# Patient Record
Sex: Female | Born: 1985 | Race: White | Hispanic: No | Marital: Single
Health system: Midwestern US, Community
[De-identification: ages and names within clinical notes are randomized; demographics above are authoritative.]

## PROBLEM LIST (undated history)

## (undated) DIAGNOSIS — F329 Major depressive disorder, single episode, unspecified: Secondary | ICD-10-CM

## (undated) DIAGNOSIS — J45909 Unspecified asthma, uncomplicated: Secondary | ICD-10-CM

## (undated) DIAGNOSIS — N200 Calculus of kidney: Secondary | ICD-10-CM

## (undated) DIAGNOSIS — G459 Transient cerebral ischemic attack, unspecified: Secondary | ICD-10-CM

## (undated) DIAGNOSIS — Q2111 Secundum atrial septal defect: Secondary | ICD-10-CM

## (undated) DIAGNOSIS — F411 Generalized anxiety disorder: Secondary | ICD-10-CM

## (undated) DIAGNOSIS — Q211 Atrial septal defect: Secondary | ICD-10-CM

## (undated) DIAGNOSIS — I639 Cerebral infarction, unspecified: Secondary | ICD-10-CM

## (undated) DIAGNOSIS — F3289 Other specified depressive episodes: Secondary | ICD-10-CM

## (undated) HISTORY — DX: Transient cerebral ischemic attack, unspecified: G45.9

## (undated) HISTORY — DX: Secundum atrial septal defect: Q21.11

## (undated) HISTORY — DX: Generalized anxiety disorder: F41.1

## (undated) HISTORY — DX: Other specified depressive episodes: F32.89

## (undated) HISTORY — DX: Major depressive disorder, single episode, unspecified: F32.9

## (undated) HISTORY — DX: Atrial septal defect: Q21.1

## (undated) HISTORY — DX: Cerebral infarction, unspecified: I63.9

## (undated) HISTORY — DX: Calculus of kidney: N20.0

---

## 2004-09-20 ENCOUNTER — Ambulatory Visit (HOSPITAL_COMMUNITY): Admission: RE | Admit: 2004-09-20 | Discharge: 2004-09-20 | Payer: Self-pay | Admitting: Urology

## 2005-09-27 ENCOUNTER — Other Ambulatory Visit: Admission: RE | Admit: 2005-09-27 | Discharge: 2005-09-27 | Payer: Self-pay | Admitting: Gynecology

## 2006-05-09 ENCOUNTER — Emergency Department (HOSPITAL_COMMUNITY): Admission: EM | Admit: 2006-05-09 | Discharge: 2006-05-09 | Payer: Self-pay | Admitting: Emergency Medicine

## 2007-01-10 ENCOUNTER — Other Ambulatory Visit: Admission: RE | Admit: 2007-01-10 | Discharge: 2007-01-10 | Payer: Self-pay | Admitting: Gynecology

## 2007-08-26 ENCOUNTER — Other Ambulatory Visit: Admission: RE | Admit: 2007-08-26 | Discharge: 2007-08-26 | Payer: Self-pay | Admitting: Gynecology

## 2007-09-19 ENCOUNTER — Emergency Department (HOSPITAL_COMMUNITY): Admission: EM | Admit: 2007-09-19 | Discharge: 2007-09-19 | Payer: Self-pay | Admitting: Emergency Medicine

## 2007-09-30 ENCOUNTER — Ambulatory Visit: Payer: Self-pay

## 2007-09-30 ENCOUNTER — Encounter (INDEPENDENT_AMBULATORY_CARE_PROVIDER_SITE_OTHER): Payer: Self-pay | Admitting: Neurology

## 2007-10-05 DIAGNOSIS — I639 Cerebral infarction, unspecified: Secondary | ICD-10-CM

## 2007-10-05 HISTORY — DX: Cerebral infarction, unspecified: I63.9

## 2007-10-16 ENCOUNTER — Ambulatory Visit: Payer: Self-pay

## 2007-10-24 ENCOUNTER — Ambulatory Visit: Payer: Self-pay | Admitting: Internal Medicine

## 2007-11-04 ENCOUNTER — Ambulatory Visit: Payer: Self-pay | Admitting: Cardiology

## 2007-11-04 ENCOUNTER — Ambulatory Visit (HOSPITAL_COMMUNITY): Admission: RE | Admit: 2007-11-04 | Discharge: 2007-11-04 | Payer: Self-pay | Admitting: Cardiology

## 2007-12-05 HISTORY — PX: PATENT FORAMEN OVALE CLOSURE: SHX2181

## 2008-02-17 ENCOUNTER — Ambulatory Visit: Payer: Self-pay | Admitting: Cardiology

## 2008-02-17 LAB — CONVERTED CEMR LAB
Basophils Absolute: 0.2 10*3/uL — ABNORMAL HIGH (ref 0.0–0.1)
Eosinophils Absolute: 0.1 10*3/uL (ref 0.0–0.6)
Eosinophils Relative: 1.7 % (ref 0.0–5.0)
HCT: 39.7 % (ref 36.0–46.0)
Lymphocytes Relative: 25 % (ref 12.0–46.0)
MCV: 91.4 fL (ref 78.0–100.0)
Monocytes Absolute: 0.3 10*3/uL (ref 0.2–0.7)
RBC: 4.34 M/uL (ref 3.87–5.11)
RDW: 12 % (ref 11.5–14.6)
TSH: 0.94 microintl units/mL (ref 0.35–5.50)

## 2008-04-03 ENCOUNTER — Other Ambulatory Visit: Admission: RE | Admit: 2008-04-03 | Discharge: 2008-04-03 | Payer: Self-pay | Admitting: Gynecology

## 2008-04-24 ENCOUNTER — Ambulatory Visit: Payer: Self-pay | Admitting: Cardiology

## 2008-04-24 ENCOUNTER — Ambulatory Visit: Payer: Self-pay

## 2008-04-24 ENCOUNTER — Encounter: Payer: Self-pay | Admitting: Cardiology

## 2009-01-18 ENCOUNTER — Encounter: Payer: Self-pay | Admitting: Cardiology

## 2009-01-18 ENCOUNTER — Ambulatory Visit: Payer: Self-pay | Admitting: Cardiology

## 2009-01-18 ENCOUNTER — Ambulatory Visit: Payer: Self-pay

## 2009-04-14 ENCOUNTER — Telehealth: Payer: Self-pay | Admitting: Cardiology

## 2009-04-27 ENCOUNTER — Emergency Department (HOSPITAL_COMMUNITY): Admission: EM | Admit: 2009-04-27 | Discharge: 2009-04-27 | Payer: Self-pay | Admitting: Emergency Medicine

## 2009-04-30 DIAGNOSIS — G459 Transient cerebral ischemic attack, unspecified: Secondary | ICD-10-CM | POA: Insufficient documentation

## 2009-04-30 DIAGNOSIS — Q211 Atrial septal defect: Secondary | ICD-10-CM | POA: Insufficient documentation

## 2009-04-30 DIAGNOSIS — F329 Major depressive disorder, single episode, unspecified: Secondary | ICD-10-CM | POA: Insufficient documentation

## 2009-04-30 DIAGNOSIS — F411 Generalized anxiety disorder: Secondary | ICD-10-CM | POA: Insufficient documentation

## 2009-08-19 ENCOUNTER — Encounter: Payer: Self-pay | Admitting: Cardiology

## 2009-09-03 ENCOUNTER — Ambulatory Visit: Payer: Self-pay | Admitting: Women's Health

## 2009-09-03 ENCOUNTER — Other Ambulatory Visit: Admission: RE | Admit: 2009-09-03 | Discharge: 2009-09-03 | Payer: Self-pay | Admitting: Gynecology

## 2009-09-03 ENCOUNTER — Encounter: Payer: Self-pay | Admitting: Women's Health

## 2009-11-05 ENCOUNTER — Ambulatory Visit: Payer: Self-pay | Admitting: Women's Health

## 2010-05-18 ENCOUNTER — Ambulatory Visit: Payer: Self-pay | Admitting: Gynecology

## 2010-06-24 ENCOUNTER — Encounter: Admission: RE | Admit: 2010-06-24 | Discharge: 2010-06-24 | Payer: Self-pay | Admitting: Gastroenterology

## 2010-07-28 ENCOUNTER — Ambulatory Visit: Payer: Self-pay | Admitting: Gynecology

## 2010-08-03 ENCOUNTER — Telehealth: Payer: Self-pay | Admitting: Cardiology

## 2010-11-16 ENCOUNTER — Ambulatory Visit: Payer: Self-pay | Admitting: Women's Health

## 2010-12-09 ENCOUNTER — Encounter
Admission: RE | Admit: 2010-12-09 | Discharge: 2010-12-09 | Payer: Self-pay | Source: Home / Self Care | Attending: Family Medicine | Admitting: Family Medicine

## 2010-12-25 ENCOUNTER — Encounter: Payer: Self-pay | Admitting: Family Medicine

## 2010-12-25 ENCOUNTER — Encounter: Payer: Self-pay | Admitting: Gastroenterology

## 2011-01-03 NOTE — Progress Notes (Signed)
Summary: calling re form  Phone Note Call from Patient   Caller: Patient Reason for Call: Talk to Nurse Summary of Call: pt sending a fax re preexisting conditions-would like a call before form filled out to make sure we do it correctly-pls call [phone] Initial call taken by: Glynda Jaeger,  August 03, 2010 1:25 PM  Follow-up for Phone Call        spoke with pt, she is aware I am out of the [place] office until friday. will call and discuss paperwork with her on friday Deliah Goody, RN  August 03, 2010 2:06 PM  left message for pt of our fax number and to refax to me today if poss Deliah Goody, RN  August 09, 2010 11:08 AM   Additional Follow-up for Phone Call Additional follow up Details #1::        paperwork complete and mailed to insurance company Deliah Goody, RN  August 10, 2010 3:34 PM      Appended Document: calling re form Patient called to request original form from 08/20/10 to get faxed to [employer]. We are unable to process her request without a fax or phone number. Form placed in the mail.

## 2011-03-14 LAB — URINALYSIS, ROUTINE W REFLEX MICROSCOPIC
Nitrite: NEGATIVE
Protein, ur: NEGATIVE mg/dL
Specific Gravity, Urine: 1.025 (ref 1.005–1.030)
Urobilinogen, UA: 0.2 mg/dL (ref 0.0–1.0)

## 2011-03-14 LAB — DIFFERENTIAL
Eosinophils Absolute: 0.1 10*3/uL (ref 0.0–0.7)
Lymphocytes Relative: 25 % (ref 12–46)
Lymphs Abs: 1.7 10*3/uL (ref 0.7–4.0)
Neutro Abs: 4.5 10*3/uL (ref 1.7–7.7)
Neutrophils Relative %: 65 % (ref 43–77)

## 2011-03-14 LAB — POCT CARDIAC MARKERS
CKMB, poc: <1 ng/mL — ABNORMAL LOW (ref 1.0–8.0)
Myoglobin, poc: 29.2 ng/mL (ref 12–200)

## 2011-03-14 LAB — BASIC METABOLIC PANEL
BUN: 11 mg/dL (ref 6–23)
Calcium: 9 mg/dL (ref 8.4–10.5)
Creatinine, Ser: 0.74 mg/dL (ref 0.4–1.2)
GFR calc Af Amer: >60 mL/min (ref >60)
GFR calc non Af Amer: >60 mL/min (ref >60)

## 2011-03-14 LAB — CBC
MCV: 94.2 fL (ref 78.0–100.0)
Platelets: 219 10*3/uL (ref 150–400)
WBC: 6.9 10*3/uL (ref 4.0–10.5)

## 2011-03-14 LAB — POCT PREGNANCY, URINE: Preg Test, Ur: NEGATIVE

## 2011-04-04 DIAGNOSIS — N200 Calculus of kidney: Secondary | ICD-10-CM

## 2011-04-04 HISTORY — DX: Calculus of kidney: N20.0

## 2011-04-04 HISTORY — PX: OTHER SURGICAL HISTORY: SHX169

## 2011-04-18 NOTE — Letter (Signed)
October 24, 2007    Pramod P. Pearlean Brownie, MD.  [address]  [address].  [place], [place], [address].   RE:  Gloria, Cruz  MRN:  [id number]  /  DOB:  08-Jun-1986   Dear Janalyn Shy:   I hope you are well.   It was a pleasure to see [name] at your request.  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 Anne Hahn.  That was an isolated episode, it  resolved in its entirety.   She underwent a series of MRI scans, which found nothing specific.  She  was then referred to you for a transcranial Doppler, which was abnormal  suggestive of a large right-to-left shunt.   She has in her past life been quite fit, playing tennis and swimming,  she is a Consulting civil engineer now.   Her past medical history in addition to above is notable for anxiety.   Her medications when you saw her included Wellbutrin and Ritalin taken  as needed, and she also takes birth control pills, it is not clear to me  whether she is taking those now or not.  She is currently taking aspirin  at 81 mg.   She has no known drug allergies.   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   PHYSICAL EXAMINATION:  GENERAL:  She is a healthy-appearing, young,  Caucasian female appearing her stated age of 22.  VITAL SIGNS:  Her blood pressure was 117/70, her pulse was 81, her  weight was 121.  HEENT EXAM:  No icterus or xanthomata.  NECK:  Neck veins were flat, the carotids were brisk and full  bilaterally without bruits.  BACK:  Without kyphosis or scoliosis.  LUNGS:  Clear.  HEART:  The  heart sounds were regular with a normally splitting S2.  ABDOMEN:  Soft with active bowel sounds without midline pulsation or  hepatomegaly.  EXTREMITIES:  Femoral pulses were 2+, distal pulses were intact, and  there was no clubbing, cyanosis, or edema.  NEUROLOGICAL EXAM:  Grossly normal.  SKIN:  Warm and dry.   Electrocardiogram dated today demonstrated sinus rhythm at 81 with  intervals of .15/.07/.38, the axis was 77 degrees.   Review of her ultrasound done without bubbles on the 27th of October  demonstrated normal left ventricular function, normal left atrial and  right atrial size, and right ventricular volumes as well.   IMPRESSION:  1. Transient neurological spell of unclear mechanistic cause.  2. Abnormal transcranial Doppler study suggestive of a patent foramen      ovale (PFO).  3. Normal transthoracic echocardiogram.   Janalyn Shy Akesha Uresti has an abnormal transcranial Doppler study in  the context of an unusual neurological event.  I spent quite a long time  trying to review with the patient and her mother the physiology of left-  to-right shunt, the potential mechanisms of left-to-right shunt, and  also shared with them some of the controversy related to treatment of  left-to-right shunt in the absence or even in  the presence of symptoms  and the lack of certainty that therapy is associated with the  elimination of risks of recurrent neurological events.   I did discuss with my colleague the importance of fully understanding  the mechanism of left-to-right shunting and so we have arranged for her  to have a transesophageal echo.   In addition, given the controversies related to PFO closure, I thought  an academic opinion would be most appropriate for this young lady.   I have taken the privilege of contacting Dr. Nolon Rod at [facility], who is  quite familiar with this whole issue, and I am sure he will look forward  to trying to understand with Dr. Anne Hahn  whether  the patient's neurological event is explainable as a potential  paradoxical embolism issue and if so what would be appropriate options  for the patient to consider.   Thank you very much for the consultation.    Sincerely,      Duke Salvia, MD, [facility]  Electronically Signed    SCK/MedQ  DD: 10/24/2007  DT: 10/24/2007  Job #: [id number]

## 2011-04-18 NOTE — Assessment & Plan Note (Signed)
[facility] HEALTHCARE                            CARDIOLOGY OFFICE NOTE   SERRA, YOUNAN                    MRN:          [id number]  DATE:04/24/2008                            DOB:          07/27/1986    HISTORY OF PRESENT ILLNESS:  Ms. [name] is a pleasant 25 year old  female who has a history of PFO associated with a TIA.  She underwent  PFO closure by Dr. Evelene Croon at [facility] in January 2009.  When I  last saw her on 02/17/2008, she was having some difficulties with  Plavix.  However, she did complete this through the middle of May and  now is off of this.  She has felt well.  She has had no rashes, and she  denies any shortness of breath, chest pain, palpitations or syncope.  There is no pedal edema.   MEDICATIONS:  Baby aspirin 81 mg daily.   PHYSICAL EXAMINATION:  VITAL SIGNS:  Blood pressure of 107/73, pulse 71.  She weighs 121 pounds.  HEENT:  Normal.  NECK:  Supple.  CHEST:  Clear.  CARDIOVASCULAR:  Regular rate and rhythm.  ABDOMEN:  Shows no tenderness.  EXTREMITIES:  Show no edema.  She did have an echocardiogram performed  on Apr 24, 2008.  This showed normal LV function.  There is trivial  aortic insufficiency.  There is a catheter atrial septal defect  occlusion device closure with no identified residual left-to-right  shunting.   DIAGNOSES:  1. Status post closure of PFO in early January.  Ms. [name] has had      no recurrent TIA symptoms and she is now off of her Plavix.  She      will continue her aspirin 81 mg p.o. daily.  Previous CBC and TSH      were normal.  She will need a follow-up echocardiogram in January,      and if there is no evidence of right-to-left shunt, I will see her      back on as-needed basis.  She will continue with SBE prophylaxis.      We will see her back in January after she has her echocardiogram.  2. History of transient ischemic attack.  As per #1.     Madolyn Frieze Jens Som, MD, [facility]  Electronically Signed    BSC/MedQ  DD: 04/24/2008  DT: 04/24/2008  Job #: [id number]   cc:   Cardiology, [facility] Dr. Roselyn Reef

## 2011-04-18 NOTE — Assessment & Plan Note (Signed)
[facility] HEALTHCARE                            CARDIOLOGY OFFICE NOTE   [name], [name]                    MRN:          [id number]  DATE:11/04/2007                            DOB:          [birth year]    Ms. [name] is a 25 year old patient of Duke Salvia, MD, who was  recently seen for a possible TIA and patent foramen ovale.  I performed  a transesophageal echocardiogram on her today.  We did find that she had  a positive microcavitation study.  Following the procedure I explained  this in detail to her parents.  I explained that it was not clear that  she had had a TIA previously, but Dr. Anne Hahn of neurology was evaluating  this.  Apparently an MRI showed no evidence of a stroke.  If indeed Dr.  Anne Hahn does feel that this was a TIA, then certainly there is a higher  incidence of embolic events with a PFO.  I explained the options for  treating this including aspirin and Coumadin for medical therapy versus  PFO closure.  They have requested to see a physician to consider PFO  closure indeed if Dr. Anne Hahn does feel that this was a TIA.  They  requested to be seen at [facility] and I contacted Dr. Orland Dec, who performs those procedures at [facility].  He will see the  patient in his office on November 06, 2007.  He will make a final  decision concerning whether she does require this procedure or merely  requires aspirin at this point.  I have also explained that she will  need to follow up with Dr. Anne Hahn as scheduled on November 07, 2007, for  further consideration of whether this, indeed, was a TIA versus the  result of a migraine headache.  Note, later in the afternoon after the  procedure, the patient contacted me.  She was not aware of any of the  above as she had not had discussions with her parents and she had been  sedated at the time we finished the case.  I explained all of the above  in detail again to the patient and she stated  that I could speak with  her parents about any of these issues.     Madolyn Frieze Jens Som, MD, [facility]  Electronically Signed    BSC/MedQ  DD: 11/04/2007  DT: 11/05/2007  Job #: [id number]

## 2011-04-18 NOTE — Assessment & Plan Note (Signed)
[facility] HEALTHCARE                            CARDIOLOGY OFFICE NOTE   MICHALLE, RADEMAKER                    MRN:          [id number]  DATE:01/18/2009                            DOB:          1986-08-29    [name] is a pleasant female, who has a history of patent foramen  ovale associated with TIA.  She underwent closure of this by Dr. Evelene Croon  at [facility] in January 2009.  Since I last saw her on Apr 24, 2008, she is doing well with no chest pain, shortness of breath,  palpitations, or syncope.  She has had no neurological symptoms.  Her  medications include a baby aspirin 81 mg p.o. daily.   PHYSICAL EXAMINATION:  VITAL SIGNS:  Blood pressure of 120/72 and her  pulse is 58.  She weighs 130 pounds.  HEENT:  Normal.  NECK:  Supple.  CHEST:  Clear.  CARDIOVASCULAR:  Regular rate and rhythm.  ABDOMEN:  No tenderness.  EXTREMITIES:  No edema.   Electrocardiogram shows sinus rhythm at a rate of 58.  There are no ST  changes noted.   DIAGNOSES:  1. Status post closure of patent foramen ovale in early January 2009 -      [name] has had no recurrent transient ischemic attack      symptoms.  She will continue on her aspirin.  She had an      echocardiogram with bubble study today.  We will wait those      results.  If it is normal, then she will not need to come back and      see [place] except on a p.r.n. basis.  2. History of transient ischemic attack - as per status post closure      of patent foramen ovale in early January 2009.     Madolyn Frieze Jens Som, MD, [facility]  Electronically Signed    BSC/MedQ  DD: 01/18/2009  DT: 01/19/2009  Job #: [id number]

## 2011-04-18 NOTE — Consult Note (Signed)
NAMEHARLO, JASO             ACCOUNT NO.:  [id number]   MEDICAL RECORD NO.:  [id number]          PATIENT TYPE:  EMS   LOCATION:  ED                           FACILITY[facility]  [facility]   PHYSICIAN:  Marlan Palau, M.D.  DATE OF BIRTH:  [birth year]   DATE OF CONSULTATION:  09/19/2007  DATE OF DISCHARGE:                                 CONSULTATION   HISTORY OF PRESENT ILLNESS:  [name] is a 25 year old right-  handed white female born 1985-12-19 with a history of anxiety,  depression, currently followed by [name], M.D.  This patient had  an episode today shortly after taking a 2 hours nap.  The patient had  been under a lot of stress as a student, had a test this morning, took a  nap afterwards, woke up around 11:30 a.m.  The patient answered  telephone found that she could not speak properly.  The patient had  garbled nonsensical speech initially.  The patient felt confused was  unable to figure out how to turn off the TV.  The patient claims she had  her hand on the TV, but did not know what to do with it.  The patient  had no particular focal numbness or weakness on the face, arms or legs.  The patient has had undulation in her ability to talk for about a hour  and a half afterwards, at times be mute, would have to point to things  to get the idea across, but clearly understood spoken speech.  The  patient however, could not perform handwriting either.  The patient  denies any visual field changes and was walking normally and had no  balance issues.  The patient did not develop headache before, during or  after the above event.  The patient has undergone a CT scan of the head  that is unremarkable.  Neurology was asked to see this patient for  further evaluation.   PAST MEDICAL HISTORY:  Significant for:  1. Episode of transient aphasia, duration about an hour and a half      with full resolution.  2. Anxiety and depression.   MEDICATIONS:  At this time  include:  1. Ritalin if needed.  2. Birth control pills.  3. The patient had a recently been on Wellbutrin, has been off this,      was placed on Lexapro took a couple of doses of this and went off      of it.  The patient is on no medications otherwise at this point.   Smokes two to three cigarettes daily.  Drinks alcohol on occasion.   NO KNOWN ALLERGIES.   SOCIAL HISTORY:  The patient is single, lives in [place], [place] was in this area for school.  Patient has no children.   FAMILY MEDICAL HISTORY:  Mother is alive.  Father is alive, both parents  alive and well.  Patient has one brother who is alive and well.  No  family history of seizures or migraine is noted.   REVIEW OF SYSTEMS:  Notable for no recent fevers, chills.  The patient  denies headache.  Denies visual field changes.  Felt somewhat numb all  over with the above event, almost anxious or nervous feeling.  Denied  any weakness per se.  Denies any shortness of breath, chest pain,  palpitations of the heart, although she has noted some increased heart  rate with anxiety.  Denies any problems controlling bowels or bladder.  The patient has not had any blackout episodes.   PHYSICAL EXAMINATION:  VITALS:  Blood pressure 108/67, heart rate 69,  respiratory rate 16, temperature afebrile.  GENERAL:  The patient is a  well-developed white female who is alert, cooperative at the time of  examination.  HEENT EXAMINATION:  The head is atraumatic.  Eyes:  Pupils equal, round  and reactive to light.  Disks are flat bilaterally.  NECK:  Supple.  No carotid bruits noted.  RESPIRATORY EXAMINATION:  Clear.  CARDIOVASCULAR:  Reveals a regular rate and rhythm.  No obvious murmurs,  rubs noted.  EXTREMITIES:  Without significant edema.  NEUROLOGIC EXAMINATION:  Cranial nerves as above.  Facial symmetry is  present.  The patient has good pinprick sensation of face bilaterally,  has good strength of facial muscles, muscles  of head turning, shoulder  shrug bilaterally.  Speech is well enunciated and not aphasic.  Extraocular movements are full, visual fields are full.  Motor testing  shows 05/05 strength in all four.  Good symmetric motor is noted  throughout.  Sensory testing is intact to pinprick, soft touch,  proprioception throughout.  Position sense intact in all four.  No  evidence of extinction is noted.  Patient has good finger-nose-finger,  heel-to-shin.  Gait normal, tandem gait normal.  Romberg negative.  No  drift is seen.  Deep tendon reflexes are symmetric.  Normal toes  downgoing bilaterally.   Laboratory values are unavailable.   CT of the head is as above.   IMPRESSION:  1. Transient episode of aphasia, etiology unclear.  2. History of bilateral renal calculi.   This patient has had a preexisting history of anxiety and depression  followed by [name], M.D.  The episode above occurred after a long  nap, but the duration of the confusion and speech problems was an hour  and a half would unlikely to make this episode a sleep related event.  Need to consider a migraine equivalent or TIA event.  Risk factors for  stroke would include the use of birth control pills and tobacco.  We  will pursue further workup.   PLAN:  1. MRI of the brain.  2. MRI angiogram of intracranial vessels.  This will be done today.  3. We will check routine blood work and urine drug screen as well.  4. If above studies unremarkable, the patient will be discharged to      home and is to discontinue to use of tobacco as an outpatient.  5. We will consider a workup to include carotid Doppler study, 2-D      echocardiogram and a possible transcranial Doppler bubble study.      We will also consider an EEG study to evaluate for possible seizure-      type event.  NIH stroke scale score is zero.  The patient is not a      candidate for TPA.      Marlan Palau, M.D.  Electronically Signed     CKW/MEDQ   D:  09/19/2007  T:  09/20/2007  Job:  [id number]   cc:  [facility]  [address] [place]  [place], [place]   [name], M.D.  Fax: [phone]

## 2011-04-18 NOTE — Assessment & Plan Note (Signed)
[facility] HEALTHCARE                            CARDIOLOGY OFFICE NOTE   [name], [name]                    MRN:          [id number]  DATE:02/17/2008                            DOB:          18-Mar-1986    [name] is a 25 year old female that was initially seen by Dr. Graciela Husbands  on October 24, 2007.  She had had a prior TIA and had abnormal  transcranial Doppler study suggestive of a PFO.  We subsequently  performed a transesophageal echocardiogram.  She was found to have a  PFO.  She was referred to Dr. Evelene Croon at [facility] for closure.  The procedure was performed on December 12, 2007. Note, a preprocedure  evaluation showed no evidence of a hypercoagulable state.  She has seen  Dr. Evelene Croon back and follow-up echocardiogram showed no residual shunt.  It was recommended that she be treated with aspirin and Plavix for 6  months as well as SBE prophylaxis with follow-up echocardiograms at 6  months and 1 year.  [name] contacted the office today to be seen as  an add on.  The patient states that since the procedure, she has felt  fatigued and weak.  She also states that she had developed significant  bruising on her lower extremities and flu-like symptoms.  She felt this  was related to the Plavix and she did discontinue this medication on her  own 2 weeks ago.  She states the bruising has now improved.  Note she  denies any fevers or chills and there is no productive cough.  She  denies any dyspnea or chest pain.  She did complain of arthralgias which  have improved as well although the fatigue persists.  Because of the  above she wanted to be seen today.   CURRENT MEDICATIONS:  Baby aspirin 81 mg p.o. daily.   PHYSICAL EXAMINATION:  Blood pressure of 115/70 and her pulse is 90.  She weighs 117 pounds.  HEENT:  Normal.  NECK:  Supple.  CHEST:  Clear.  CARDIOVASCULAR:  Regular rate and rhythm.  ABDOMEN:  No tenderness and there is no  organomegaly appreciated.  EXTREMITIES:  Show no edema.  There is a small bruise on her right lower  extremity but there is no other bruising noted.  Note her distal pulses  are intact.  I can not appreciate any stigmata of peripheral emboli.   Her electrocardiogram today shows a sinus rhythm at a rate of 90.  The  axis is normal.  There are no significant ST changes.   DIAGNOSIS:  1. Status post closure of PFO in early January - [name]      discontinued her Plavix on her own 2 weeks ago due to bruising and      flu-like symptoms.  It is not clear to me that this was related to      the Plavix.  I did discuss this with Dr. Evelene Croon today and he would      prefer that she complete at least 3-4 months of Plavix.  I      discussed this  with [name] today and she has agreed to resume      for four additional weeks.  She will continue on aspirin her 81 mg      p.o. daily.  I will check a CBC today to check her hemoglobin,      hematocrit and also to rule out thrombocytopenia.  We will also      check a TSH given her complaint of fatigue.  As stated above, we      will discontinue her Plavix in 4 weeks.  She will need a follow-up      echocardiogram in July and then 6 months following that.  She will      also continue with SBE prophylaxis and we discussed this today.  2. History of transient ischemic attack.   I will see her back in July.  She was explained the risks of  discontinuing her Plavix including potential CVA related to thrombus on  the closure device.     Madolyn Frieze Jens Som, MD, [facility]  Electronically Signed    BSC/MedQ  DD: 02/17/2008  DT: 02/18/2008  Job #: [id number]   cc:   Roena Malady, MD

## 2011-04-20 ENCOUNTER — Telehealth: Payer: Self-pay | Admitting: Cardiology

## 2011-04-20 NOTE — Telephone Encounter (Signed)
Faxed OV, CXR & Labs to [facility] ([phone]).

## 2011-04-21 NOTE — H&P (Signed)
[name], [name]             ACCOUNT NO.:  [id number]   MEDICAL RECORD NO.:  [id number]          PATIENT TYPE:  AMB   LOCATION:  DAY                          FACILITY:  [facility]   PHYSICIAN:  Mark C. Vernie Ammons, M.D.  DATE OF BIRTH:  1986-09-08   DATE OF ADMISSION:  09/20/2004  DATE OF DISCHARGE:                                HISTORY & PHYSICAL   HISTORY:  The patient is a 25 year old white female who was seen in my  office as a new patient earlier in the day.  She had noted that 2 weeks ago  she was having pain in her right side.  It lasted for 4 hours.  She was seen  by her gynecologist who performed an ultrasound.  The pelvic ultrasound was  negative and there was thought there might be a stone. She had pain on the  right side at that time but now she comes in today with pain on the left  side. This is associated with gross hematuria, nausea and vomiting,  frequency, urgency.  She has been on Levaquin, Hydrocodone is not touching  the pain.  She was in severe distress when seen in the office.   PAST MEDICAL HISTORY:  Negative for kidney stones and also negative for any  other major medical problems.   PAST SURGICAL HISTORY:  None.   MEDICATIONS:  None other than as noted above.   ALLERGIES:  No known drug allergies.   SOCIAL HISTORY:  No tobacco or ethanol use.   FAMILY HISTORY:  Father is 34, mother is 75 and they are both in good  health. There is a grandparent with kidney stones.   REVIEW OF SYSTEMS:  As noted above, otherwise completely negative.   Pelvic ultrasound revealed what appeared to be possible ruptured ovarian  cyst and a cyst in the inferior pole on the left hand side.  The right side  appeared normal.   LABORATORY DATA:  Her urinalysis had positive blood, positive protein,  positive leukocyte esterase but no nitrites, and 15 to 50 red cells.   PHYSICAL EXAMINATION:  GENERAL:  The patient is a well-developed, well-  nourished, thin white female in  severe distress.  VITAL SIGNS:  Blood pressure is 131/88, pulse 82, temperature 97.2.  HEENT:  Normocephalic, atraumatic.  Oropharynx is clear.  NECK:  Supple with midline trachea.  CHEST:  Reveals normal respiratory effort.  CARDIOVASCULAR:  Regular rate and rhythm.  ABDOMEN:  Soft without peritoneal signs.  She had no significant CVAT.  She  had mild discomfort to palpation of the lower abdomen bilaterally.  She had  no masses.  GU:  She has normal external female genitalia, normally placed ureteral  meatus and no lesions or discharge.  Vaginal mucosa is normal.  No  periurethral masses or tenderness. No bladder based abnormality.  Cervix,  uterus and adnexae palpably normal. She has normal anus and perineum.  SKIN:  Warm and dry.  EXTREMITIES:  Without clubbing, cyanosis or edema.  NEURO:  She is alert and oriented with appropriate mood and affect.   A CT scan performed in my  office revealed normal appearing kidneys, there  are 2 small punctate stones approximately 1-2 mm in size in her right  kidney.  She has bilateral ureterectasis and bilateral calcifications in the  pelvis near the UVJ on both sides. The bladder otherwise appears normal.   IMPRESSION:  This patient has had gross hematuria, has bilateral flank pain,  and what appeared to be bilateral distal ureteral calculi.  This is  extremely rare.  I discussed that with her and her mother but because of her  severe pain and bilateral hydronephrosis, I have recommended surgical  intervention.  We discussed just a stent placement.  We also discussed  lithotripsy but that is not available at this time.  The best treatment in  my opinion would be ureteroscopic stone extraction at this time.  We  discussed the procedure, its risks and complications including but not  limited to bleeding, infection and anesthetic risk as well as ureteral  perforation.  Understanding, they have elected to proceed with surgery.   PLAN:  1.  She  received 100 mg of Demerol in my office with 25 mg of Phenergan IM      with good control of her pain.  2.  She will be scheduled for semi-emergent bilateral ureteroscopy and stone      extraction.      MCO/MEDQ  D:  09/20/2004  T:  09/20/2004  Job:  [id number]

## 2011-04-21 NOTE — Op Note (Signed)
[name], [name]             ACCOUNT NO.:  [id number]   MEDICAL RECORD NO.:  [id number]          PATIENT TYPE:  AMB   LOCATION:  DAY                          FACILITY:  [facility]   PHYSICIAN:  Mark C. Vernie Ammons, M.D.  DATE OF BIRTH:  06-19-1986   DATE OF PROCEDURE:  09/20/2004  DATE OF DISCHARGE:                                 OPERATIVE REPORT   PREOPERATIVE DIAGNOSIS:  Bilateral ureteral calculi with hydronephrosis.   POSTOPERATIVE DIAGNOSIS:  Bilateral ureteral calculi with hydronephrosis.   PROCEDURE:  Cystoscopy, bilateral retrograde pyelograms with interpretation,  bilateral ureteroscopic stone extraction.   SURGEON:  Mark C. Vernie Ammons, M.D.   ANESTHESIA:  General.   BLOOD LOSS:  None.   DRAINS:  None.   SPECIMENS:  Stone to patient.   COMPLICATIONS:  None.   INDICATIONS:  The patient is a 25 year old white female who had right flank  pain about four days ago.  She then presented to my office today with left  flank pain.  A CT scan revealed bilateral distal ureteral calculi with  hydroureter seen bilaterally as well.  She was in severe pain.  She was  therefore brought to the O.R. semiemergently for stone extraction.  The  risks, complications, and alternatives were discussed.   DESCRIPTION OF OPERATION:  After informed consent, the patient was brought  to the major O.R. and placed on the table and administered general  anesthesia and moved into the dorsal lithotomy position.  Her genitalia were  sterilely prepped and draped, and a 21 French cystoscope with 12-degree lens  was inserted into the bladder.  The bladder was noted to be free of any  tumor, stones, or inflammatory lesions, and the ureteral orifices were  normal in configuration and position.   A 6 French open-ended ureteral catheter was then placed in the right  ureteral orifice, and a right retrograde pyelogram was performed that  revealed a filling defect in the distal right ureter consistent with  stone,  with some dilatation of the ureter proximal to that.  Identical procedure  was performed on the left side, and identical findings were noted.   A 6 French rigid ureteroscope was then used.  It was passed into the bladder  and easily passed into the right ureteral orifice.  The stone was  visualized, photographed, and grasped with a Nitinol basket and extracted  without difficulty.  I then reinserted the ureteroscope easily into the left  ureter and photographed the stone.  I grasped it with Nitinol basket and  extracted that as well.  The bladder was then drained.  The patient was  awakened and taken to the recovery room in stable satisfactory condition.  She tolerated the procedure well, with no intraoperative complications.  She  will be given a prescription for Tylox, #14; and Enablex 15 mg, #2.  I gave  her an excuse in order to excuse her from her midterm test tomorrow, and she  will be discharged from the postanesthesia recovery area once fully  recovered.  She will follow up in my office in one week.  I will send her  stones at that time for stone analysis.      MCO/MEDQ  D:  09/20/2004  T:  09/20/2004  Job:  [id number]

## 2011-04-24 ENCOUNTER — Other Ambulatory Visit (HOSPITAL_COMMUNITY): Payer: Self-pay | Admitting: Urology

## 2011-04-24 ENCOUNTER — Other Ambulatory Visit: Payer: Self-pay | Admitting: Urology

## 2011-04-24 ENCOUNTER — Encounter (HOSPITAL_COMMUNITY): Payer: BC Managed Care – PPO

## 2011-04-24 ENCOUNTER — Ambulatory Visit (HOSPITAL_COMMUNITY)
Admission: RE | Admit: 2011-04-24 | Discharge: 2011-04-24 | Disposition: A | Discharge status: Home / Self Care | Payer: BC Managed Care – PPO | Source: Ambulatory Visit | Attending: Urology | Admitting: Urology

## 2011-04-24 DIAGNOSIS — N209 Urinary calculus, unspecified: Secondary | ICD-10-CM | POA: Insufficient documentation

## 2011-04-24 DIAGNOSIS — Z01810 Encounter for preprocedural cardiovascular examination: Secondary | ICD-10-CM | POA: Insufficient documentation

## 2011-04-24 DIAGNOSIS — Z01811 Encounter for preprocedural respiratory examination: Secondary | ICD-10-CM

## 2011-04-24 DIAGNOSIS — Z01812 Encounter for preprocedural laboratory examination: Secondary | ICD-10-CM | POA: Insufficient documentation

## 2011-04-24 LAB — BASIC METABOLIC PANEL
BUN: 25 mg/dL — ABNORMAL HIGH (ref 6–23)
CO2: 22 mEq/L (ref 19–32)
Calcium: 8.5 mg/dL (ref 8.4–10.5)
Chloride: 101 mEq/L (ref 96–112)
Creatinine, Ser: 1.07 mg/dL (ref 0.4–1.2)
GFR calc Af Amer: >60 mL/min (ref >60)
GFR calc non Af Amer: >60 mL/min (ref >60)
Glucose, Bld: 70 mg/dL (ref 70–99)
Potassium: 3.5 mEq/L (ref 3.5–5.1)
Sodium: 135 mEq/L (ref 135–145)

## 2011-04-24 LAB — CBC
HCT: 36.2 % (ref 36.0–46.0)
Hemoglobin: 12.2 g/dL (ref 12.0–15.0)
MCH: 30.3 pg (ref 26.0–34.0)
MCHC: 33.7 g/dL (ref 30.0–36.0)
MCV: 89.8 fL (ref 78.0–100.0)
Platelets: 229 10*3/uL (ref 150–400)
RBC: 4.03 MIL/uL (ref 3.87–5.11)
RDW: 12.3 % (ref 11.5–15.5)
WBC: 9.2 10*3/uL (ref 4.0–10.5)

## 2011-04-24 LAB — SURGICAL PCR SCREEN
MRSA, PCR: NEGATIVE
Staphylococcus aureus: POSITIVE — AB

## 2011-04-24 LAB — HCG, SERUM, QUALITATIVE: Preg, Serum: NEGATIVE

## 2011-04-28 ENCOUNTER — Ambulatory Visit (HOSPITAL_COMMUNITY)
Admission: RE | Admit: 2011-04-28 | Discharge: 2011-04-28 | Disposition: A | Discharge status: Home / Self Care | Payer: BC Managed Care – PPO | Source: Ambulatory Visit | Attending: Urology | Admitting: Urology

## 2011-04-28 DIAGNOSIS — N200 Calculus of kidney: Secondary | ICD-10-CM | POA: Insufficient documentation

## 2011-04-28 DIAGNOSIS — R109 Unspecified abdominal pain: Secondary | ICD-10-CM | POA: Insufficient documentation

## 2011-04-28 DIAGNOSIS — N201 Calculus of ureter: Secondary | ICD-10-CM | POA: Insufficient documentation

## 2011-05-07 ENCOUNTER — Emergency Department (HOSPITAL_COMMUNITY): Payer: BC Managed Care – PPO

## 2011-05-07 ENCOUNTER — Emergency Department (HOSPITAL_COMMUNITY)
Admission: EM | Admit: 2011-05-07 | Discharge: 2011-05-07 | Disposition: A | Discharge status: Home / Self Care | Payer: BC Managed Care – PPO | Attending: Emergency Medicine | Admitting: Emergency Medicine

## 2011-05-07 DIAGNOSIS — R1032 Left lower quadrant pain: Secondary | ICD-10-CM | POA: Insufficient documentation

## 2011-05-07 DIAGNOSIS — Z87442 Personal history of urinary calculi: Secondary | ICD-10-CM | POA: Insufficient documentation

## 2011-05-07 LAB — URINALYSIS, ROUTINE W REFLEX MICROSCOPIC
Glucose, UA: NEGATIVE mg/dL
Hgb urine dipstick: NEGATIVE
Ketones, ur: NEGATIVE mg/dL
Protein, ur: >300 mg/dL — AB

## 2011-05-07 LAB — POCT I-STAT, CHEM 8
BUN: 15 mg/dL (ref 6–23)
Calcium, Ion: 1.15 mmol/L (ref 1.12–1.32)
Creatinine, Ser: 1.1 mg/dL (ref 0.4–1.2)
TCO2: 22 mmol/L (ref 0–100)

## 2011-05-07 LAB — DIFFERENTIAL
Basophils Absolute: 0 10*3/uL (ref 0.0–0.1)
Eosinophils Relative: 4 % (ref 0–5)
Lymphocytes Relative: 27 % (ref 12–46)
Neutro Abs: 2.9 10*3/uL (ref 1.7–7.7)

## 2011-05-07 LAB — URINE MICROSCOPIC-ADD ON

## 2011-05-07 LAB — CBC
HCT: 34.9 % — ABNORMAL LOW (ref 36.0–46.0)
Hemoglobin: 11.8 g/dL — ABNORMAL LOW (ref 12.0–15.0)
RDW: 12 % (ref 11.5–15.5)
WBC: 4.9 10*3/uL (ref 4.0–10.5)

## 2011-05-07 NOTE — Op Note (Signed)
[name], [name]             ACCOUNT NO.:  [id number]  MEDICAL RECORD NO.:  [id number]           PATIENT TYPE:  O  LOCATION:  DAYL                         FACILITY:  [facility]  PHYSICIAN:  [name], M.D.  DATE OF BIRTH:  20-Apr-1986  DATE OF PROCEDURE:  04/28/2011 DATE OF DISCHARGE:                              OPERATIVE REPORT   PREOPERATIVE DIAGNOSIS:  Left ureteral stone.  POSTOPERATIVE DIAGNOSIS:  Left ureteral stone.  PROCEDURE: 1. Cystoscopy with left retrograde pyelogram including interpretation. 2. Left ureteroscopy. 3. Laser lithotripsy. 4. Ureteroscopic stone extraction. 5. Double-J stent placement.  SURGEON:  [name], M.D.  ANESTHESIA:  General.  ESTIMATED BLOOD LOSS:  Minimal.  DRAINS:  5-French, 24-cm Polaris stent (with string).  SPECIMEN:  Stone given to the patient.  COMPLICATIONS:  None.  INDICATIONS:  The patient is a 25 year old female who had developed left flank pain consistent with renal colic and was found to have a 6 x 4 mm stone in the left proximal ureter.  She elected to proceed with lithotripsy initially.  However, because of the scheduling, this was not available as timely as she would have liked and therefore she elected to proceed with ureteroscopic extraction of the stone.  The procedure was discussed as well as its risks and complications.  The patient understands and has elected to proceed.  DESCRIPTION OF OPERATION:  After informed consent, the patient was brought to the major OR, placed on the table, administered general anesthesia and then moved to the dorsal lithotomy position.  Her genitalia was sterilely prepped and draped and an official time-out was then performed.  The 22-French cystoscope with 12-degrees lens was then introduced in the bladder and the bladder was fully and systematically inspected and noted to be free of any tumor, stones or inflammatory lesions.  Ureteral orifices were of normal  configuration and position.  A left retrograde pyelogram was then performed in the standard fashion by passing a 6-French open-ended ureteral catheter through the cystoscope and into the left ureteral orifice.  I then injected full- strength contrast under direct fluoroscopy up the left ureter, which was noted to be entirely normal until a filling defect which was identified several centimeters below the ureteropelvic junction consistent with her known left proximal ureteral stone.  The remainder of the collecting system was noted to be entirely normal without any mass effect or filling defects.  A 0.038-inch floppy tip guidewire was then passed through the open-ended catheter, past the stone and into the area of the renal pelvis under fluoroscopy.  This was left in place and a short ureteral access sheath was then passed over the guidewire, gently through the intramural ureter and then up the ureter.  I then used the 6-French digital flexible ureteroscope and passed this through the access sheath to where the stone was located, which was identified.  It appeared large enough that it would not be amenable to basket extraction without fragmentation and therefore I proceeded with laser lithotripsy.  The 200 micron holmium laser fiber was passed through the ureteroscope and used to fragment the stone into several smaller pieces.  These were  grasped with the nitinol basket and extracted without difficulty.  I then, after assuring that the ureter was completely free of stones, passed the scope on up into the kidney and systematically inspected each calix.  No stones were seen so the guidewire was then passed through the ureteroscope into the renal pelvis and left in place while the ureteroscope and access sheath were removed.  The cystoscope was back loaded over the guidewire and the Polaris stent was then passed over the guidewire and into the area of the renal pelvis and as the  guidewire was removed, good curl was noted in the renal pelvis.  The string was left affixed to the distal aspect of the stent and affixed to the patient's lower abdomen and she was awakened and taken to the recovery room in stable and satisfactory condition.  She tolerated the procedure well with no intraoperative complications.  She will be given written instructions and will follow up with me in one week for stent removal.  I have given her a prescription for Pyridium 200 mg #30 and Vicodin HP #30.     [name], M.D.     MCO/MEDQ  D:  04/28/2011  T:  04/28/2011  Job:  [id number]  Electronically Signed by [name] M.D. on 05/07/2011 01:51:53 PM

## 2011-07-13 ENCOUNTER — Ambulatory Visit (INDEPENDENT_AMBULATORY_CARE_PROVIDER_SITE_OTHER): Payer: BC Managed Care – PPO | Admitting: Women's Health

## 2011-07-13 ENCOUNTER — Encounter: Payer: Self-pay | Admitting: Women's Health

## 2011-07-13 ENCOUNTER — Other Ambulatory Visit (HOSPITAL_COMMUNITY)
Admission: RE | Admit: 2011-07-13 | Discharge: 2011-07-13 | Disposition: A | Discharge status: Home / Self Care | Payer: BC Managed Care – PPO | Source: Ambulatory Visit | Attending: Women's Health | Admitting: Women's Health

## 2011-07-13 VITALS — BP 110/60 | Ht 66.0 in | Wt 104.0 lb

## 2011-07-13 DIAGNOSIS — Z01419 Encounter for gynecological examination (general) (routine) without abnormal findings: Secondary | ICD-10-CM

## 2011-07-13 DIAGNOSIS — Z309 Encounter for contraceptive management, unspecified: Secondary | ICD-10-CM

## 2011-07-13 MED ORDER — NORETHINDRONE 0.35 MG PO TABS: 1.0000 | ORAL_TABLET | Freq: Every day | ORAL | Status: DC

## 2011-07-13 NOTE — Progress Notes (Signed)
[name] August 16, 1986 [id number]    History:    The patient presents for annual exam.  25yo Engaged WF G0, monthly 4-5 day cycle, using withdrawal and condoms for contraception. Cortisol series completed. She does have a history of a stroke in 2008,. with unknown reason. PFO closure surgery January of 2009. She had a kidney stone removed may 25th 2012.   Past medical history, past surgical history, family history and social history were all reviewed and documented in the EPIC chart.   ROS:  A  ROS was performed and pertinent positives and negatives are included in the history.  Exam:  Filed Vitals:   07/13/11 1038  BP: 110/60    General appearance:  Normal Head/Neck:  Normal, without cervical or supraclavicular adenopathy. Thyroid:  Symmetrical, normal in size, without palpable masses or nodularity. Respiratory  Effort:  Normal  Auscultation:  Clear without wheezing or rhonchi Cardiovascular  Auscultation:  Regular rate, without rubs, murmurs or gallops  Edema/varicosities:  Not grossly evident Abdominal   Soft,nontender, without masses, guarding or rebound.  Liver/spleen:  No organomegaly noted  Hernia:  None appreciated  Occult test:   Skin  Inspection:  Grossly normal  Palpation:  Grossly normal Neurologic/psychiatric  Orientation:  Normal with appropriate conversation.  Mood/affect:  Normal  Genitourinary    Breasts: Examined lying and sitting.     Right: Without masses, retractions, discharge or axillary adenopathy.     Left: Without masses, retractions, discharge or axillary adenopathy.   Inguinal/mons:  Normal without inguinal adenopathy  External genitalia:  Normal  BUS/Urethra/Skene's glands:  Normal  Bladder:  Normal  Vagina:  Normal  Cervix:  Normal  Uterus:   normal in size, shape and contour.  Midline and mobile  Adnexa/parametria:     Rt: Without masses or tenderness.   Lt: Without masses or tenderness.  Anus and perineum: Normal  Digital  rectal exam: Normal sphincter tone without palpated masses or tenderness  Assessment/Plan:  25 y.o. year old female for annual exam.   UA Pap only today, states had numerous labs in May when had surgery for kidney stone and she states all her labs were normal. She is getting married next year, did review possible medication when she tries to conceive, to prevent clots due to history of stroke in 08. Is moving to [place] and did review importance of early preconceptual, prenatal care. Contraception options reviewed, reviewed no products-containing estrogen. She has tried Implanon which was removed one month later, Mirena IUD which was removed one month later due to problems. She has used Depo-Provera in the past and states did not have any problems but did not like having to come to the office for the injection. Did review progestin only pill did review may have some spotting with that would like to try did review slight risk for blood clots without but minimal prescription proper use of Micronor was given did review importance of condoms especially the first month. Will start first day of next cycle.Marland Kitchen SBE is, exercise, multivitamin daily encouraged.    [facility], 11:27 AM 07/13/2011

## 2011-07-13 NOTE — Assessment & Plan Note (Signed)
Kidney stone removal 04/28/11

## 2011-09-13 LAB — CBC
HCT: 36.1
MCHC: 35.2
Platelets: 250
RDW: 12.1

## 2011-09-13 LAB — COMPREHENSIVE METABOLIC PANEL
Albumin: 2.9 — ABNORMAL LOW
BUN: 9
Calcium: 8.3 — ABNORMAL LOW
Creatinine, Ser: 0.66
Potassium: 3.7
Total Protein: 5.9 — ABNORMAL LOW

## 2011-09-13 LAB — RAPID URINE DRUG SCREEN, HOSP PERFORMED
Barbiturates: NOT DETECTED
Cocaine: NOT DETECTED
Opiates: NOT DETECTED
Tetrahydrocannabinol: NOT DETECTED

## 2011-09-13 LAB — DIFFERENTIAL
Eosinophils Relative: 2
Lymphocytes Relative: 32
Lymphs Abs: 2
Monocytes Relative: 8
Neutrophils Relative %: 56

## 2011-09-13 LAB — PROTIME-INR
INR: 1
Prothrombin Time: 13.3

## 2011-12-28 ENCOUNTER — Telehealth: Payer: Self-pay | Admitting: Cardiology

## 2011-12-28 NOTE — Telephone Encounter (Signed)
New Msg: Pt calling wanting to speak with MD regarding pt trying to conceive and wanting to know if pt past heart related health history could effect pt trying to conceive. Please return pt call to discuss further.

## 2011-12-28 NOTE — Telephone Encounter (Signed)
Pt with a hx of closure of patent foramen ovale in 2009 and also had a TIA. She wanted to make sure there was no danger in getting preg. Or delivery. She wanted to know if there was an increased risk of stroke or problems. Discussed with dr cooper. Pt made aware there is no danger.

## 2011-12-28 NOTE — Telephone Encounter (Signed)
Spoke with pt, she has a hx of closure of patent forman

## 2012-01-09 ENCOUNTER — Other Ambulatory Visit: Payer: Self-pay | Admitting: *Deleted

## 2012-01-09 DIAGNOSIS — Z309 Encounter for contraceptive management, unspecified: Secondary | ICD-10-CM

## 2012-01-09 MED ORDER — NORETHINDRONE 0.35 MG PO TABS: 1.0000 | ORAL_TABLET | Freq: Every day | ORAL | Status: DC

## 2012-07-03 ENCOUNTER — Telehealth: Payer: Self-pay | Admitting: *Deleted

## 2012-07-03 ENCOUNTER — Telehealth: Payer: Self-pay | Admitting: Cardiology

## 2012-07-03 NOTE — Telephone Encounter (Signed)
Telephone call, instructed to schedule an appointment with her cardiologist to discuss possible anticoagulant therapy prior to pregnancy. Currently on baby aspirin daily. (stroke 2008, PFO closure 1/09)  Instructed to take a prenatal vitamin daily until ready to conceive.

## 2012-07-03 NOTE — Telephone Encounter (Signed)
Please return call to patient regarding medical treatment

## 2012-07-03 NOTE — Telephone Encounter (Signed)
Pt is would like to speak with you if possible # [phone]. She married and thinking about planning a baby, she had heart surgery about 4-5 years ago and said she never did follow up with cardiologist to clear if everything okay to proceed with pregnancy. Pt would just like you guidance on what should be done prior pregnancy. Please advise

## 2012-07-03 NOTE — Telephone Encounter (Signed)
The calling to question if it is OK for her to get pregnant with her history of PFO closure.  She saw her OB/GYN and she was questioning if she should be on a blood thinner.  Pt is scheduled to she Dr Jens Som in follow up to discuss.

## 2012-07-23 ENCOUNTER — Encounter: Payer: Self-pay | Admitting: *Deleted

## 2012-07-24 ENCOUNTER — Ambulatory Visit (INDEPENDENT_AMBULATORY_CARE_PROVIDER_SITE_OTHER): Payer: BC Managed Care – PPO | Admitting: Cardiology

## 2012-07-24 ENCOUNTER — Encounter: Payer: Self-pay | Admitting: Cardiology

## 2012-07-24 VITALS — BP 118/70 | HR 57 | Wt 113.0 lb

## 2012-07-24 DIAGNOSIS — Q2111 Secundum atrial septal defect: Secondary | ICD-10-CM

## 2012-07-24 DIAGNOSIS — G459 Transient cerebral ischemic attack, unspecified: Secondary | ICD-10-CM

## 2012-07-24 DIAGNOSIS — Q211 Atrial septal defect: Secondary | ICD-10-CM

## 2012-07-24 DIAGNOSIS — Q2112 Patent foramen ovale: Secondary | ICD-10-CM

## 2012-07-24 NOTE — Assessment & Plan Note (Signed)
Status post PFO closure.

## 2012-07-24 NOTE — Assessment & Plan Note (Signed)
Previous patent foramen ovale closure. No symptoms. Repeat echocardiogram with microcavitation. If no shunt I see no contraindication to pregnancy. I also do not think she needs to be anticoagulated.

## 2012-07-24 NOTE — Progress Notes (Signed)
  HPI: 26 year old female with past medical history of TIA associated with patent foramen ovale status post closure for evaluation prior to pregnancy. Patient had closure of her PFO at [facility] in January of 2009. Last echocardiogram in February of 2010 revealed normal LV function and a bubble study was negative indicating no residual shunt. I last saw her in February of 2010. Patient denies dyspnea, chest pain, palpitations or syncope. She is recently married and is contemplating pregnancy. We were asked to evaluate prior to this.  Current Outpatient Prescriptions  Medication Sig Dispense Refill  . buPROPion (WELLBUTRIN SR) 150 MG 12 hr tablet Take 150 mg by mouth 1 day or 1 dose.        . clonazePAM (KLONOPIN) 1 MG tablet prn      . topiramate (TOPAMAX) 100 MG tablet Take 100 mg by mouth 1 day or 1 dose.          No Known Allergies  Past Medical History  Diagnosis Date  . Stroke 10/2007  . Kidney stone may 2012  . ANXIETY   . DEPRESSION   . TRANSIENT ISCHEMIC ATTACK   . PATENT FORAMEN OVALE     Past Surgical History  Procedure Date  . Patent foramen ovale closure 12/2007  . Kidney stone removal May 2012    History   Social History  . Marital Status: Married    Spouse Name: N/A    Number of Children: N/A  . Years of Education: N/A   Occupational History  . Not on file.   Social History Main Topics  . Smoking status: Never Smoker   . Smokeless tobacco: Never Used  . Alcohol Use: Yes     Occasional  . Drug Use: No  . Sexually Active: Yes -- Female partner(s)    Birth Control/ Protection: None   Other Topics Concern  . Not on file   Social History Narrative  . No narrative on file    Family History  Problem Relation Age of Onset  . Heart disease      No family history    ROS: no fevers or chills, productive cough, hemoptysis, dysphasia, odynophagia, melena, hematochezia, dysuria, hematuria, rash, seizure activity, orthopnea, PND, pedal edema,  claudication. Remaining systems are negative.  Physical Exam:   Blood pressure 118/70, pulse 57, weight 113 lb (51.256 kg).  General:  Well developed/well nourished in NAD Skin warm/dry Patient not depressed No peripheral clubbing Back-normal HEENT-normal/normal eyelids Neck supple/normal carotid upstroke bilaterally; no bruits; no JVD; no thyromegaly chest - CTA/ normal expansion CV - RRR/normal S1 and S2; no murmurs, rubs or gallops;  PMI nondisplaced Abdomen -NT/ND, no HSM, no mass, + bowel sounds, no bruit 2+ femoral pulses, no bruits Ext-no edema, chords, 2+ DP Neuro-grossly nonfocal  ECG sinus rhythm at a rate of 57. No ST changes.

## 2012-07-24 NOTE — Patient Instructions (Addendum)
Your physician recommends that you schedule a follow-up appointment in: AS NEEDED PENDING TEST RESULTS  Your physician has requested that you have an echocardiogram. Echocardiography is a painless test that uses sound waves to create images of your heart. It provides your doctor with information about the size and shape of your heart and how well your heart's chambers and valves are working. This procedure takes approximately one hour. There are no restrictions for this procedure.    

## 2012-07-26 ENCOUNTER — Other Ambulatory Visit (HOSPITAL_COMMUNITY): Payer: Self-pay | Admitting: Cardiology

## 2012-07-26 DIAGNOSIS — Q211 Atrial septal defect: Secondary | ICD-10-CM

## 2012-07-29 ENCOUNTER — Ambulatory Visit (HOSPITAL_COMMUNITY): Payer: BC Managed Care – PPO | Attending: Internal Medicine

## 2012-07-29 ENCOUNTER — Encounter (HOSPITAL_COMMUNITY): Payer: Self-pay

## 2012-07-29 DIAGNOSIS — I379 Nonrheumatic pulmonary valve disorder, unspecified: Secondary | ICD-10-CM | POA: Insufficient documentation

## 2012-07-29 DIAGNOSIS — Q211 Atrial septal defect: Secondary | ICD-10-CM

## 2012-07-29 DIAGNOSIS — I079 Rheumatic tricuspid valve disease, unspecified: Secondary | ICD-10-CM | POA: Insufficient documentation

## 2012-07-29 DIAGNOSIS — I359 Nonrheumatic aortic valve disorder, unspecified: Secondary | ICD-10-CM | POA: Insufficient documentation

## 2012-07-29 NOTE — Progress Notes (Signed)
Echocardiogram performed.  

## 2012-07-29 NOTE — Progress Notes (Unsigned)
Patient ID: [name], female   DOB: [birth year], 26 y.o.   MRN: [id number] IV with 20 G angiocath (R) AC for Echo Bubble Study. Irean Hong, RN.

## 2012-10-08 ENCOUNTER — Other Ambulatory Visit: Payer: Self-pay

## 2012-10-22 ENCOUNTER — Ambulatory Visit (HOSPITAL_COMMUNITY): Payer: BC Managed Care – PPO

## 2012-10-29 ENCOUNTER — Encounter (HOSPITAL_COMMUNITY): Payer: BC Managed Care – PPO

## 2012-11-05 ENCOUNTER — Ambulatory Visit (HOSPITAL_COMMUNITY)
Admission: RE | Admit: 2012-11-05 | Discharge: 2012-11-05 | Disposition: A | Discharge status: Home / Self Care | Payer: 59 | Source: Ambulatory Visit | Attending: Obstetrics & Gynecology | Admitting: Obstetrics & Gynecology

## 2012-11-05 ENCOUNTER — Encounter (HOSPITAL_COMMUNITY): Payer: Self-pay

## 2012-11-05 DIAGNOSIS — F329 Major depressive disorder, single episode, unspecified: Secondary | ICD-10-CM | POA: Insufficient documentation

## 2012-11-05 DIAGNOSIS — G459 Transient cerebral ischemic attack, unspecified: Secondary | ICD-10-CM

## 2012-11-05 DIAGNOSIS — Q2111 Secundum atrial septal defect: Secondary | ICD-10-CM | POA: Insufficient documentation

## 2012-11-05 DIAGNOSIS — F32A Depression, unspecified: Secondary | ICD-10-CM | POA: Insufficient documentation

## 2012-11-05 DIAGNOSIS — F3289 Other specified depressive episodes: Secondary | ICD-10-CM | POA: Insufficient documentation

## 2012-11-05 DIAGNOSIS — G43909 Migraine, unspecified, not intractable, without status migrainosus: Secondary | ICD-10-CM

## 2012-11-05 DIAGNOSIS — Z8673 Personal history of transient ischemic attack (TIA), and cerebral infarction without residual deficits: Secondary | ICD-10-CM | POA: Insufficient documentation

## 2012-11-05 DIAGNOSIS — Z3169 Encounter for other general counseling and advice on procreation: Secondary | ICD-10-CM

## 2012-11-05 DIAGNOSIS — Q211 Atrial septal defect: Secondary | ICD-10-CM | POA: Insufficient documentation

## 2012-11-05 DIAGNOSIS — IMO0002 Reserved for concepts with insufficient information to code with codable children: Secondary | ICD-10-CM | POA: Insufficient documentation

## 2012-11-05 NOTE — Progress Notes (Addendum)
MATERNAL FETAL MEDICINE CONSULT  Patient Name: [name] Medical Record Number:  [id number] Date of Birth: 06/04/1986 Requesting Physician Name:  Mitchel Honour, DO Date of Service: 11/05/2012  Chief Complaint Preconception visit-Hx of TIA, Patent foramen ovale with closure.  History of Present Illness [name] was seen today for prenatal diagnosis secondary to history of TIA prior to diagnosis of and closure of patent foramen ovale(PFO) at the request of Dr. Mitchel Honour.  The patient is a 26 y.o. G0P0 who is not currently pregnant but has been trying to conceive x 2-3 months.  [name] had a TIA at 26 yo after which it was discovered she had a PFO.  She underwent surgical closure of the PFO in 09' at [facility] and has done well since.  She sees Dr. Jens Som for cardiology and had a normal echocardiogram in August of this year.  Per his note there is no contraindication from a cardiac perspective to pregnancy and anticoagulation is not needed.  She denies any additional TIA's since that initial event.  She is supposed to be taking an 81 mg ASA daily but has not been doing so recently.  She additionally has a history of migraines for which she is taking Topamax and Depression and Anxiety for which she takes wellbutrin daily and prn klonopin.  She reports her migraines occur monthly but were more frequent prior to beginning topamax.   Review of Systems Pertinent items are noted in HPI. Otherwise review of systems is negative.    Patient History OB History    Grav Para Term Preterm Abortions TAB SAB Ect Mult Living   0               Past Medical History  Diagnosis Date  . Stroke 10/2007  . Kidney stone may 2012  . ANXIETY   . DEPRESSION   . TRANSIENT ISCHEMIC ATTACK   . PATENT FORAMEN OVALE     Past Surgical History  Procedure Date  . Patent foramen ovale closure 12/2007  . Kidney stone removal May 2012    History   Social History  . Marital  Status: Married    Spouse Name: N/A    Number of Children: N/A  . Years of Education: N/A   Social History Main Topics  . Smoking status: Never Smoker   . Smokeless tobacco: Never Used  . Alcohol Use: Yes     Comment: Occasional  . Drug Use: No  . Sexually Active: Yes -- Female partner(s)    Birth Control/ Protection: None   Other Topics Concern  . Not on file   Social History Narrative  . No narrative on file    Family History  Problem Relation Age of Onset  . Heart disease      No family history   In addition, the patient has no family history of mental retardation, birth defects, or genetic diseases.   Assessment and Recommendations [name] is a 26 yo G0  Here for a preconception visit- She is advised to start a prenatal vitamin.     1. History of TIA- At this time her TIA was an isolated event and is thought to have been linked to her patent foramen ovale which has since been closed.  Pregnancy is an hypercoagulable state, but due to repair of the likely cause prophylactic anticoagulation is not needed.  2. History of Patent Foramen ovale, s/p closure-  Per cardiology due to her normal Echo in  8/13' there are no contraindications to pregnancy at this time.  She does not need anticoagulation per their recommendations.  She was counseled that a personal history of congenital cardiac disease puts her at a slightly increased risk of fetal heart differences.  She is counseled that we typically quote a 3-5% risk.  She is counseled that a PFO is normal in fetal life and that if the baby were to have this same issue it would not be detectable prenatally.  However due to a slightly increased risk of other cardiac defects as well a fetal echo would be advisable at 18-24 weeks once she conceives.   3. Depression in pregnancy  The majority of episodes of depressive disorder during pregnancy tend to increase and are more likely to recur with subsequent pregnancies. There also is an  increased risk of postpartum psychosis as high as 46%. The risk-benefit ratio of continuing medication for control of psychiatric disorders should be weighed against the potential and/or theoretical risk to the fetus. As maternal psychiatric illness, if inadequately treated or untreated, may result in poor compliance with prenatal care, inadequate nutrition, exposure to additional medication or herbal remedies, increased alcohol and tobacco use, deficits in mother-infant bonding, and disruptions within the family environment. As each case should be individualized, she was counseled that there is little data on the safety of wellbutrin use in pregnancy, however, the benefit from continuing on a regimen that provides her stable psychiatric control may, in this case, outweigh the potential (unknown) theoretical risk to the fetus. She was advised as such. The limited data of fetal exposure to wellbutrin does not suggest an increased risk of fetal anomalies or adverse pregnancy events. She was encouraged to continue this medication during pregnancy. She was encouraged to continue her preconceptual care and psychiatric care with her providers.  Klonopin is a category D medication and as she takes it infrequently at baseline she is advised to limit its use as much as safely possible during pregnancy/conception.   4. Migraines- The patient does have a history of chronic migraines that is controlled on Topamax.  Migraine often improves after the first trimester. Management should include avoidance of any precipitating factors. Medication usually used for prophylactic treatment should be avoided in pregnancy if possible, especially the tryptamine-based drugs and ergotamine-containing preparations. Should the headaches become more problematic, restarting the Topamax with the understanding of the risks is an option for the patient. She is advised that there is an increased risk of facial clefting should she continue the  topamax.    Thank you for allowing [place] to participate in the care of your patient.     Jayonna Meyering, MD   30 minutes was spent with the patient, >50% of which was face to face counseling.

## 2012-11-05 NOTE — Consult Note (Signed)
MATERNAL FETAL MEDICINE CONSULT  Patient Name: [name]  Medical Record Number: [id number]  Date of Birth: July 04, 1986  Requesting Physician Name: Mitchel Honour, DO  Date of Service: 11/05/2012  Chief Complaint  Preconception visit-Hx of TIA, Patent foramen ovale with closure.  History of Present Illness  [name] was seen today for prenatal diagnosis secondary to history of TIA prior to diagnosis of and closure of patent foramen ovale(PFO) at the request of Dr. Mitchel Honour. The patient is a 26 y.o. G0P0 who is not currently pregnant but has been trying to conceive x 2-3 months. [name] had a TIA at 26 yo after which it was discovered she had a PFO. She underwent surgical closure of the PFO in 09' at [facility] and has done well since. She sees Dr. Jens Som for cardiology and had a normal echocardiogram in August of this year. Per his note there is no contraindication from a cardiac perspective to pregnancy and anticoagulation is not needed. She denies any additional TIA's since that initial event. She is supposed to be taking an 81 mg Gloria daily but has not been doing so recently. She additionally has a history of migraines for which she is taking Topamax and Depression and Anxiety for which she takes wellbutrin daily and prn klonopin. She reports her migraines occur monthly but were more frequent prior to beginning topamax.  Review of Systems  Pertinent items are noted in HPI. Otherwise review of systems is negative.  Patient History  OB History    Grav  Para  Term  Preterm  Abortions  TAB  SAB  Ect  Mult  Living    0               Past Medical History   Diagnosis  Date   .  Stroke  10/2007   .  Kidney stone  may 2012   .  ANXIETY    .  DEPRESSION    .  TRANSIENT ISCHEMIC ATTACK    .  PATENT FORAMEN OVALE     Past Surgical History   Procedure  Date   .  Patent foramen ovale closure  12/2007   .  Kidney stone removal  May 2012    History    Social  History   .  Marital Status:  Married     Spouse Name:  N/A     Number of Children:  N/A   .  Years of Education:  N/A    Social History Main Topics   .  Smoking status:  Never Smoker   .  Smokeless tobacco:  Never Used   .  Alcohol Use:  Yes      Comment: Occasional   .  Drug Use:  No   .  Sexually Active:  Yes -- Female partner(s)     Birth Control/ Protection:  None    Other Topics  Concern   .  Not on file    Social History Narrative   .  No narrative on file    Family History   Problem  Relation  Age of Onset   .  Heart disease        No family history    In addition, the patient has no family history of mental retardation, birth defects, or genetic diseases.  Assessment and Recommendations  [name] is a 26 yo G0 Here for a preconception visit- She is advised to start a prenatal vitamin.  1. History of TIA- At this time her TIA was an isolated event and is thought to have been linked to her patent foramen ovale which has since been closed. Pregnancy is an hypercoagulable state, but due to repair of the likely cause prophylactic anticoagulation is not needed.  2. History of Patent Foramen ovale, s/p closure-  Per cardiology due to her normal Echo in 8/13' there are no contraindications to pregnancy at this time. She does not need anticoagulation per their recommendations. She was counseled that a personal history of congenital cardiac disease puts her at a slightly increased risk of fetal heart differences. She is counseled that we typically quote a 3-5% risk. She is counseled that a PFO is normal in fetal life and that if the baby were to have this same issue it would not be detectable prenatally. However due to a slightly increased risk of other cardiac defects as well a fetal echo would be advisable at 18-24 weeks once she conceives.  3. Depression in pregnancy  The majority of episodes of depressive disorder during pregnancy tend to increase and are more likely to recur  with subsequent pregnancies. There also is an increased risk of postpartum psychosis as high as 46%. The risk-benefit ratio of continuing medication for control of psychiatric disorders should be weighed against the potential and/or theoretical risk to the fetus. As maternal psychiatric illness, if inadequately treated or untreated, may result in poor compliance with prenatal care, inadequate nutrition, exposure to additional medication or herbal remedies, increased alcohol and tobacco use, deficits in mother-infant bonding, and disruptions within the family environment. As each case should be individualized, she was counseled that there is little data on the safety of wellbutrin use in pregnancy, however, the benefit from continuing on a regimen that provides her stable psychiatric control may, in this case, outweigh the potential (unknown) theoretical risk to the fetus. She was advised as such. The limited data of fetal exposure to wellbutrin does not suggest an increased risk of fetal anomalies or adverse pregnancy events. She was encouraged to continue this medication during pregnancy. She was encouraged to continue her preconceptual care and psychiatric care with her providers. Klonopin is a category D medication and as she takes it infrequently at baseline she is advised to limit its use as much as safely possible during pregnancy/conception.  4. Migraines- The patient does have a history of chronic migraines that is controlled on Topamax. Migraine often improves after the first trimester. Management should include avoidance of any precipitating factors. Medication usually used for prophylactic treatment should be avoided in pregnancy if possible, especially the tryptamine-based drugs and ergotamine-containing preparations. Should the headaches become more problematic, restarting the Topamax with the understanding of the risks is an option for the patient. She is advised that there is an increased risk of  facial clefting should she continue the topamax.  Thank you for allowing [place] to participate in the care of your patient.  Molly Maduro, MD

## 2012-11-08 NOTE — Addendum Note (Signed)
Encounter addended by: Ty Hilts, RN on: 11/08/2012 12:35 PM<BR>     Documentation filed: Charges VN

## 2013-01-03 LAB — OB RESULTS CONSOLE GC/CHLAMYDIA: Gonorrhea: NEGATIVE

## 2013-01-03 LAB — OB RESULTS CONSOLE ANTIBODY SCREEN: Antibody Screen: NEGATIVE

## 2013-01-03 LAB — OB RESULTS CONSOLE ABO/RH

## 2013-01-03 LAB — OB RESULTS CONSOLE HEPATITIS B SURFACE ANTIGEN: Hepatitis B Surface Ag: NEGATIVE

## 2013-01-03 LAB — OB RESULTS CONSOLE RUBELLA ANTIBODY, IGM: Rubella: IMMUNE

## 2013-01-03 LAB — OB RESULTS CONSOLE HIV ANTIBODY (ROUTINE TESTING): HIV: NONREACTIVE

## 2013-05-22 ENCOUNTER — Encounter (HOSPITAL_COMMUNITY): Payer: Self-pay | Admitting: *Deleted

## 2013-05-22 ENCOUNTER — Inpatient Hospital Stay (HOSPITAL_COMMUNITY)
Admission: AD | Admit: 2013-05-22 | Discharge: 2013-05-22 | Disposition: A | Discharge status: Home / Self Care | Payer: 59 | Source: Ambulatory Visit | Attending: Obstetrics and Gynecology | Admitting: Obstetrics and Gynecology

## 2013-05-22 ENCOUNTER — Inpatient Hospital Stay (HOSPITAL_COMMUNITY): Payer: 59

## 2013-05-22 DIAGNOSIS — O368190 Decreased fetal movements, unspecified trimester, not applicable or unspecified: Secondary | ICD-10-CM | POA: Insufficient documentation

## 2013-05-22 DIAGNOSIS — Z36 Encounter for antenatal screening of mother: Secondary | ICD-10-CM

## 2013-05-22 DIAGNOSIS — Z3689 Encounter for other specified antenatal screening: Secondary | ICD-10-CM

## 2013-05-22 LAB — URINALYSIS, ROUTINE W REFLEX MICROSCOPIC
Leukocytes, UA: NEGATIVE
Protein, ur: NEGATIVE mg/dL
Urobilinogen, UA: 0.2 mg/dL (ref 0.0–1.0)

## 2013-05-22 NOTE — MAU Note (Signed)
Patient states she has been feeling fetal movement since about 20 weeks. States she has not felt any movement since Sunday or Monday. Denies pain, bleeding or leaking.

## 2013-05-22 NOTE — MAU Note (Signed)
C/o no fetal movement since Sunday night;

## 2013-05-22 NOTE — MAU Provider Note (Signed)
History     CSN: [id number]  Arrival date and time: 05/22/13 1739   First Provider Initiated Contact with Patient 05/22/13 1807      Chief Complaint  Patient presents with  . Decreased Fetal Movement   HPI Ms. [name] is a 27 y.o. G1P0 at [redacted]w[redacted]d who presents to MAU today with complaint of no fetal movement since Monday. The patient states that she has attempted kick counts. She denies contractions, discharge, bleeding, N/V, UTI symptoms or abdominal pain. The patient does endorse some constipation.  OB History   Grav Para Term Preterm Abortions TAB SAB Ect Mult Living   1               Past Medical History  Diagnosis Date  . Stroke 10/2007  . Kidney stone may 2012  . ANXIETY   . DEPRESSION   . TRANSIENT ISCHEMIC ATTACK   . PATENT FORAMEN OVALE     Past Surgical History  Procedure Laterality Date  . Patent foramen ovale closure  12/2007  . Kidney stone removal  May 2012    Family History  Problem Relation Age of Onset  . Heart disease      No family history    History  Substance Use Topics  . Smoking status: Never Smoker   . Smokeless tobacco: Never Used  . Alcohol Use: Yes     Comment: Occasional    Allergies: No Known Allergies  Prescriptions prior to admission  Medication Sig Dispense Refill  . albuterol (PROVENTIL HFA;VENTOLIN HFA) 108 (90 BASE) MCG/ACT inhaler Inhale 2 puffs into the lungs every 6 (six) hours as needed for wheezing.      Marland Kitchen buPROPion (WELLBUTRIN SR) 150 MG 12 hr tablet Take 150 mg by mouth daily.       . calcium carbonate (TUMS - DOSED IN MG ELEMENTAL CALCIUM) 500 MG chewable tablet Chew 2 tablets by mouth 3 (three) times daily as needed for heartburn.      . diphenhydramine-acetaminophen (TYLENOL PM) 25-500 MG TABS Take 1 tablet by mouth at bedtime as needed (for pain).      . Prenatal Vit-Fe Fumarate-FA (PRENATAL MULTIVITAMIN) TABS Take 1 tablet by mouth every morning.        Review of Systems  Constitutional: Negative for  fever and malaise/fatigue.  Gastrointestinal: Positive for constipation. Negative for nausea, vomiting, abdominal pain and diarrhea.  Genitourinary: Negative for dysuria, urgency and frequency.       Neg - vaginal bleeding, discharge  Neurological: Negative for dizziness.   Physical Exam   Blood pressure 109/58, pulse 78, temperature 97.7 F (36.5 C), resp. rate 16, height 5' 5.5" (1.664 m), weight 151 lb (68.493 kg).  Physical Exam  Constitutional: She is oriented to person, place, and time. She appears well-developed and well-nourished. No distress.  HENT:  Head: Normocephalic and atraumatic.  Cardiovascular: Normal rate, regular rhythm and normal heart sounds.   Respiratory: Effort normal and breath sounds normal. No respiratory distress.  GI: Soft. Bowel sounds are normal. She exhibits no distension and no mass. There is no tenderness. There is no rebound and no guarding.  Neurological: She is alert and oriented to person, place, and time.  Skin: Skin is warm and dry. No erythema.  Psychiatric: She has a normal mood and affect.   Results for orders placed during the hospital encounter of 05/22/13 (from the past 24 hour(s))  URINALYSIS, ROUTINE W REFLEX MICROSCOPIC     Status: Abnormal   Collection Time  05/22/13  7:35 PM      Result Value Range   Color, Urine STRAW (*) YELLOW   APPearance CLEAR  CLEAR   Specific Gravity, Urine <1.005 (*) 1.005 - 1.030   pH 7.0  5.0 - 8.0   Glucose, UA NEGATIVE  NEGATIVE mg/dL   Hgb urine dipstick NEGATIVE  NEGATIVE   Bilirubin Urine NEGATIVE  NEGATIVE   Ketones, ur NEGATIVE  NEGATIVE mg/dL   Protein, ur NEGATIVE  NEGATIVE mg/dL   Urobilinogen, UA 0.2  0.0 - 1.0 mg/dL   Nitrite NEGATIVE  NEGATIVE   Leukocytes, UA NEGATIVE  NEGATIVE    Fetal Monitoring: Baseline: 125 bpm, moderate variability, + accelerations, one isolated variable deceleration noted Contractions: occasional  MAU Course  Procedures None  MDM UA Discussed patient  including NST results with Dr. Marcelle Overlie. Order BPP BPP 8/8 Discussed results of BPP with Dr. Marcelle Overlie. Ok for discharge. Contact office in the morning and they will make an appointment for another BPP before the weekend.   Assessment and Plan  A: Decreased fetal movement Reactive NST  P: Discharge home Kick counts reviewed. Handout of AVS Follow-up in the office as scheduled or sooner if your condition were to change or worsen Patient may return to MAU as needed   Freddi Starr, PA-C  05/22/2013, 8:30 PM

## 2013-08-01 ENCOUNTER — Inpatient Hospital Stay (HOSPITAL_COMMUNITY)
Admission: AD | Admit: 2013-08-01 | Discharge: 2013-08-01 | Disposition: A | Discharge status: Home / Self Care | Payer: Managed Care, Other (non HMO) | Source: Ambulatory Visit | Attending: Obstetrics and Gynecology | Admitting: Obstetrics and Gynecology

## 2013-08-01 ENCOUNTER — Encounter (HOSPITAL_COMMUNITY): Payer: Self-pay | Admitting: *Deleted

## 2013-08-01 ENCOUNTER — Inpatient Hospital Stay (HOSPITAL_COMMUNITY): Payer: Managed Care, Other (non HMO)

## 2013-08-01 DIAGNOSIS — O368190 Decreased fetal movements, unspecified trimester, not applicable or unspecified: Secondary | ICD-10-CM | POA: Insufficient documentation

## 2013-08-01 DIAGNOSIS — W19XXXA Unspecified fall, initial encounter: Secondary | ICD-10-CM | POA: Insufficient documentation

## 2013-08-01 DIAGNOSIS — R109 Unspecified abdominal pain: Secondary | ICD-10-CM | POA: Insufficient documentation

## 2013-08-01 DIAGNOSIS — Y92009 Unspecified place in unspecified non-institutional (private) residence as the place of occurrence of the external cause: Secondary | ICD-10-CM | POA: Insufficient documentation

## 2013-08-01 LAB — WET PREP, GENITAL: Yeast Wet Prep HPF POC: NONE SEEN

## 2013-08-01 NOTE — MAU Provider Note (Signed)
  History     CSN: [id number]  Arrival date and time: 08/01/13 1055   None     Chief Complaint  Patient presents with  . Fall  . Decreased Fetal Movement   HPI  Pt is a G1P0 at 107w0d here with report of falling backward and landed on behind at 2000 last night.  No report of vaginal bleeding.  +cramping.  Fetal movements felt decreased.  Sent over for evaluation from office.  Also reports thin fluid from vagina for past 5 days.  +vaginal odor per patient.  Past Medical History  Diagnosis Date  . Stroke 10/2007  . Kidney stone may 2012  . ANXIETY   . DEPRESSION   . TRANSIENT ISCHEMIC ATTACK   . PATENT FORAMEN OVALE     Past Surgical History  Procedure Laterality Date  . Patent foramen ovale closure  12/2007  . Kidney stone removal  May 2012    Family History  Problem Relation Age of Onset  . Heart disease      No family history    History  Substance Use Topics  . Smoking status: Never Smoker   . Smokeless tobacco: Never Used  . Alcohol Use: Yes     Comment: Occasional    Allergies: No Known Allergies  Prescriptions prior to admission  Medication Sig Dispense Refill  . albuterol (PROVENTIL HFA;VENTOLIN HFA) 108 (90 BASE) MCG/ACT inhaler Inhale 2 puffs into the lungs every 6 (six) hours as needed for wheezing.      Marland Kitchen buPROPion (WELLBUTRIN SR) 150 MG 12 hr tablet Take 150 mg by mouth daily.       . calcium carbonate (TUMS - DOSED IN MG ELEMENTAL CALCIUM) 500 MG chewable tablet Chew 2 tablets by mouth 3 (three) times daily as needed for heartburn.      . diphenhydramine-acetaminophen (TYLENOL PM) 25-500 MG TABS Take 1 tablet by mouth at bedtime as needed (for pain).      . Prenatal Vit-Fe Fumarate-FA (PRENATAL MULTIVITAMIN) TABS Take 1 tablet by mouth every morning.        Review of Systems  Gastrointestinal: Positive for abdominal pain (cramping).  Genitourinary:       Vaginal discharge (thin) x 5 days  All other systems reviewed and are  negative.   Physical Exam   Blood pressure 122/60, pulse 86, temperature 98.3 F (36.8 C), temperature source Oral, resp. rate 20, height 5' 5.5" (1.664 m), weight 77.021 kg (169 lb 12.8 oz), SpO2 100.00%.  Physical Exam  Constitutional: She is oriented to person, place, and time. She appears well-developed and well-nourished. No distress.  HENT:  Head: Normocephalic.  Neck: Normal range of motion. Neck supple.  Cardiovascular: Normal rate, regular rhythm and normal heart sounds.   Respiratory: Effort normal and breath sounds normal.  GI: Soft. There is no tenderness.  Genitourinary: No bleeding around the vagina. Vaginal discharge (mucusy, no pooling, negative ferns) found.  Musculoskeletal: Normal range of motion. She exhibits no edema.  Neurological: She is alert and oriented to person, place, and time.  Skin: Skin is warm and dry.    MAU Course  Procedures    Assessment and Plan   Care assumed by Dr. Henderson Cloud  [facility] 08/01/2013, 11:37 AM
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
## 2013-08-01 NOTE — Progress Notes (Signed)
MD notified of Reactive NST; 8/8 BPP; and social work consult. Ordered to discharge patient and have her follow up in office on Monday or Tuesday.

## 2013-08-01 NOTE — MAU Note (Addendum)
Patient was seen in office today after having fell at home; states was walking up flight of stairs and fell backwards hitting back; was sent to MAU for evaluation. Patient states fell on back and did not hit abdomin. Reports decreased fetal movement. Denies bleeding, nor contractions. Does report clear discharge. Patient also states "under a lot of emotional stress at home lately" and expressed increased concern for evaluation of baby after fall. Patient observed crying; Request significant other to not be in room at this time.

## 2013-08-03 ENCOUNTER — Inpatient Hospital Stay (HOSPITAL_COMMUNITY): Payer: Managed Care, Other (non HMO) | Admitting: Anesthesiology

## 2013-08-03 ENCOUNTER — Encounter (HOSPITAL_COMMUNITY): Payer: Self-pay | Admitting: *Deleted

## 2013-08-03 ENCOUNTER — Encounter (HOSPITAL_COMMUNITY): Payer: Self-pay | Admitting: Anesthesiology

## 2013-08-03 ENCOUNTER — Inpatient Hospital Stay (HOSPITAL_COMMUNITY): Payer: Managed Care, Other (non HMO)

## 2013-08-03 ENCOUNTER — Inpatient Hospital Stay (HOSPITAL_COMMUNITY)
Admission: AD | Admit: 2013-08-03 | Discharge: 2013-08-05 | DRG: 775 | Disposition: A | Discharge status: Home / Self Care | Payer: Managed Care, Other (non HMO) | Source: Ambulatory Visit | Attending: Obstetrics and Gynecology | Admitting: Obstetrics and Gynecology

## 2013-08-03 HISTORY — DX: Unspecified asthma, uncomplicated: J45.909

## 2013-08-03 LAB — COMPREHENSIVE METABOLIC PANEL
CO2: 18 mEq/L — ABNORMAL LOW (ref 19–32)
Calcium: 8.6 mg/dL (ref 8.4–10.5)
Creatinine, Ser: 0.69 mg/dL (ref 0.50–1.10)
GFR calc Af Amer: >90 mL/min (ref >90)
GFR calc non Af Amer: >90 mL/min (ref >90)
Glucose, Bld: 87 mg/dL (ref 70–99)
Sodium: 134 mEq/L — ABNORMAL LOW (ref 135–145)
Total Protein: 6 g/dL (ref 6.0–8.3)

## 2013-08-03 LAB — CBC
HCT: 29.6 % — ABNORMAL LOW (ref 36.0–46.0)
Hemoglobin: 9.7 g/dL — ABNORMAL LOW (ref 12.0–15.0)
MCH: 26.9 pg (ref 26.0–34.0)
MCHC: 32.8 g/dL (ref 30.0–36.0)
RDW: 13.4 % (ref 11.5–15.5)

## 2013-08-03 LAB — RPR: RPR Ser Ql: NONREACTIVE

## 2013-08-03 MED ORDER — CITRIC ACID-SODIUM CITRATE 334-500 MG/5ML PO SOLN: 30.0000 mL | ORAL | Status: DC | PRN

## 2013-08-03 MED ORDER — ZOLPIDEM TARTRATE 5 MG PO TABS: 5.0000 mg | ORAL_TABLET | Freq: Every evening | ORAL | Status: DC | PRN

## 2013-08-03 MED ORDER — OXYCODONE-ACETAMINOPHEN 5-325 MG PO TABS
1.0000 | ORAL_TABLET | ORAL | Status: DC | PRN
  Administered 2013-08-04 – 2013-08-05 (×6): 1 via ORAL
  Filled 2013-08-03: qty 1
  Filled 2013-08-03: qty 2
  Filled 2013-08-03 (×3): qty 1

## 2013-08-03 MED ORDER — EPHEDRINE 5 MG/ML INJ
10.0000 mg | INTRAVENOUS | Status: DC | PRN
  Filled 2013-08-03: qty 2

## 2013-08-03 MED ORDER — PHENYLEPHRINE 40 MCG/ML (10ML) SYRINGE FOR IV PUSH (FOR BLOOD PRESSURE SUPPORT)
80.0000 ug | PREFILLED_SYRINGE | INTRAVENOUS | Status: DC | PRN
  Filled 2013-08-03: qty 2
  Filled 2013-08-03: qty 5

## 2013-08-03 MED ORDER — HYDROXYZINE HCL 50 MG PO TABS
50.0000 mg | ORAL_TABLET | Freq: Four times a day (QID) | ORAL | Status: DC | PRN
  Administered 2013-08-04 (×2): 50 mg via ORAL
  Filled 2013-08-03 (×3): qty 1

## 2013-08-03 MED ORDER — SIMETHICONE 80 MG PO CHEW: 80.0000 mg | CHEWABLE_TABLET | ORAL | Status: DC | PRN

## 2013-08-03 MED ORDER — HYDROXYZINE HCL 50 MG PO TABS
50.0000 mg | ORAL_TABLET | Freq: Four times a day (QID) | ORAL | Status: DC | PRN
  Administered 2013-08-03 (×2): 50 mg via ORAL
  Filled 2013-08-03 (×3): qty 1

## 2013-08-03 MED ORDER — OXYTOCIN 40 UNITS IN LACTATED RINGERS INFUSION - SIMPLE MED: 62.5000 mL/h | INTRAVENOUS | Status: DC

## 2013-08-03 MED ORDER — TERBUTALINE SULFATE 1 MG/ML IJ SOLN: 0.2500 mg | Freq: Once | INTRAMUSCULAR | Status: DC | PRN

## 2013-08-03 MED ORDER — SODIUM BICARBONATE 8.4 % IV SOLN
INTRAVENOUS | Status: DC | PRN
  Administered 2013-08-03: 5 mL via EPIDURAL

## 2013-08-03 MED ORDER — FLEET ENEMA 7-19 GM/118ML RE ENEM: 1.0000 | ENEMA | Freq: Every day | RECTAL | Status: DC | PRN

## 2013-08-03 MED ORDER — ALBUTEROL SULFATE HFA 108 (90 BASE) MCG/ACT IN AERS
2.0000 | INHALATION_SPRAY | Freq: Four times a day (QID) | RESPIRATORY_TRACT | Status: DC | PRN
  Administered 2013-08-03: 2 via RESPIRATORY_TRACT
  Filled 2013-08-03: qty 6.7

## 2013-08-03 MED ORDER — BISACODYL 10 MG RE SUPP: 10.0000 mg | Freq: Every day | RECTAL | Status: DC | PRN

## 2013-08-03 MED ORDER — BENZOCAINE-MENTHOL 20-0.5 % EX AERO
1.0000 "application " | INHALATION_SPRAY | CUTANEOUS | Status: DC | PRN
  Administered 2013-08-03 – 2013-08-05 (×2): 1 via TOPICAL
  Filled 2013-08-03 (×2): qty 56

## 2013-08-03 MED ORDER — FLEET ENEMA 7-19 GM/118ML RE ENEM: 1.0000 | ENEMA | RECTAL | Status: DC | PRN

## 2013-08-03 MED ORDER — LACTATED RINGERS IV SOLN
500.0000 mL | Freq: Once | INTRAVENOUS | Status: AC
  Administered 2013-08-03: 500 mL via INTRAVENOUS

## 2013-08-03 MED ORDER — PRENATAL MULTIVITAMIN CH
1.0000 | ORAL_TABLET | Freq: Every day | ORAL | Status: DC
  Administered 2013-08-05: 1 via ORAL
  Filled 2013-08-03 (×3): qty 1

## 2013-08-03 MED ORDER — DIBUCAINE 1 % RE OINT: 1.0000 "application " | TOPICAL_OINTMENT | RECTAL | Status: DC | PRN

## 2013-08-03 MED ORDER — PHENYLEPHRINE 40 MCG/ML (10ML) SYRINGE FOR IV PUSH (FOR BLOOD PRESSURE SUPPORT)
80.0000 ug | PREFILLED_SYRINGE | INTRAVENOUS | Status: DC | PRN
  Filled 2013-08-03: qty 2

## 2013-08-03 MED ORDER — DIPHENHYDRAMINE HCL 50 MG/ML IJ SOLN: 12.5000 mg | INTRAMUSCULAR | Status: DC | PRN

## 2013-08-03 MED ORDER — SENNOSIDES-DOCUSATE SODIUM 8.6-50 MG PO TABS
2.0000 | ORAL_TABLET | Freq: Every day | ORAL | Status: DC
  Administered 2013-08-03 – 2013-08-05 (×2): 2 via ORAL

## 2013-08-03 MED ORDER — IBUPROFEN 600 MG PO TABS
600.0000 mg | ORAL_TABLET | Freq: Four times a day (QID) | ORAL | Status: DC
  Administered 2013-08-04 – 2013-08-05 (×7): 600 mg via ORAL
  Filled 2013-08-03 (×7): qty 1

## 2013-08-03 MED ORDER — ONDANSETRON HCL 4 MG/2ML IJ SOLN: 4.0000 mg | INTRAMUSCULAR | Status: DC | PRN

## 2013-08-03 MED ORDER — TETANUS-DIPHTH-ACELL PERTUSSIS 5-2.5-18.5 LF-MCG/0.5 IM SUSP: 0.5000 mL | Freq: Once | INTRAMUSCULAR | Status: DC

## 2013-08-03 MED ORDER — ACETAMINOPHEN 325 MG PO TABS: 650.0000 mg | ORAL_TABLET | ORAL | Status: DC | PRN

## 2013-08-03 MED ORDER — BUPROPION HCL ER (XL) 300 MG PO TB24
300.0000 mg | ORAL_TABLET | Freq: Every day | ORAL | Status: DC
  Filled 2013-08-03 (×2): qty 1

## 2013-08-03 MED ORDER — LIDOCAINE HCL (PF) 1 % IJ SOLN
30.0000 mL | INTRAMUSCULAR | Status: DC | PRN
  Filled 2013-08-03 (×2): qty 30

## 2013-08-03 MED ORDER — OXYTOCIN BOLUS FROM INFUSION: 500.0000 mL | INTRAVENOUS | Status: DC

## 2013-08-03 MED ORDER — OXYCODONE-ACETAMINOPHEN 5-325 MG PO TABS
1.0000 | ORAL_TABLET | ORAL | Status: DC | PRN
  Administered 2013-08-03: 1 via ORAL
  Filled 2013-08-03: qty 1

## 2013-08-03 MED ORDER — FENTANYL 2.5 MCG/ML BUPIVACAINE 1/10 % EPIDURAL INFUSION (WH - ANES)
14.0000 mL/h | INTRAMUSCULAR | Status: DC | PRN
  Administered 2013-08-03 (×2): 14 mL/h via EPIDURAL
  Filled 2013-08-03 (×3): qty 125

## 2013-08-03 MED ORDER — ONDANSETRON HCL 4 MG PO TABS: 4.0000 mg | ORAL_TABLET | ORAL | Status: DC | PRN

## 2013-08-03 MED ORDER — BUTORPHANOL TARTRATE 1 MG/ML IJ SOLN
1.0000 mg | INTRAMUSCULAR | Status: DC | PRN
  Administered 2013-08-03 (×2): 1 mg via INTRAVENOUS
  Filled 2013-08-03 (×2): qty 1

## 2013-08-03 MED ORDER — ONDANSETRON HCL 4 MG/2ML IJ SOLN: 4.0000 mg | Freq: Four times a day (QID) | INTRAMUSCULAR | Status: DC | PRN

## 2013-08-03 MED ORDER — LACTATED RINGERS IV SOLN: 500.0000 mL | INTRAVENOUS | Status: DC | PRN

## 2013-08-03 MED ORDER — OXYTOCIN 40 UNITS IN LACTATED RINGERS INFUSION - SIMPLE MED
1.0000 m[IU]/min | INTRAVENOUS | Status: DC
  Administered 2013-08-03: 2 m[IU]/min via INTRAVENOUS
  Filled 2013-08-03: qty 1000

## 2013-08-03 MED ORDER — DIPHENHYDRAMINE HCL 25 MG PO CAPS: 25.0000 mg | ORAL_CAPSULE | Freq: Four times a day (QID) | ORAL | Status: DC | PRN

## 2013-08-03 MED ORDER — EPHEDRINE 5 MG/ML INJ
10.0000 mg | INTRAVENOUS | Status: DC | PRN
  Filled 2013-08-03: qty 2
  Filled 2013-08-03: qty 4

## 2013-08-03 MED ORDER — LACTATED RINGERS IV SOLN
INTRAVENOUS | Status: DC
  Administered 2013-08-03: 13:00:00 via INTRAVENOUS

## 2013-08-03 MED ORDER — WITCH HAZEL-GLYCERIN EX PADS: 1.0000 "application " | MEDICATED_PAD | CUTANEOUS | Status: DC | PRN

## 2013-08-03 MED ORDER — IBUPROFEN 600 MG PO TABS
600.0000 mg | ORAL_TABLET | Freq: Four times a day (QID) | ORAL | Status: DC | PRN
  Administered 2013-08-03: 600 mg via ORAL
  Filled 2013-08-03: qty 1

## 2013-08-03 MED ORDER — LANOLIN HYDROUS EX OINT: TOPICAL_OINTMENT | CUTANEOUS | Status: DC | PRN

## 2013-08-03 NOTE — MAU Note (Signed)
Pt states woke up around 30 minutes ago and had clear fluid running down her legs.

## 2013-08-03 NOTE — Progress Notes (Signed)
Called to pt room pt c/o feeling tight in her chest. Pulse ox applied O2 normal. Explained to pt that with her oxygen being 98-99% she and the baby are getting oxygen. Questioned pt as to if she thinks this could be anxiety related. Pt states yes. Offered pt meds for anxiety pt agrees.

## 2013-08-03 NOTE — Progress Notes (Signed)
Delivery Note At 6:43 PM a viable female was delivered via Vaginal, Spontaneous Delivery (Presentation:OA ;  ).  APGAR: 8, 9; weight pending .   Placenta status: Intact, Spontaneous.  Cord: 3 vessels with the following complications: None.  Cord pH: pending  Anesthesia: Epidural  Episiotomy: Median second degree Lacerations: None Suture Repair: vicryl rapide Est. Blood Loss (mL): 350  Midline episiotomy done because of suspicion impending shoulder dystocia. However, shoulders delivered spontaneously without any traction at all.  Mom to postpartum.  Baby to nursery-stable.  [name],[name] E 08/03/2013, 7:01 PM

## 2013-08-03 NOTE — Anesthesia Preprocedure Evaluation (Signed)
Anesthesia Evaluation  Patient identified by MRN, date of birth, ID band Patient awake    Reviewed: Allergy & Precautions, H&P , Patient's Chart, lab work & pertinent test results  Airway Mallampati: II  TM Distance: >3 FB Neck ROM: full    Dental  (+) Teeth Intact   Pulmonary asthma ,    breath sounds clear to auscultation       Cardiovascular  Rhythm:regular Rate:Normal     Neuro/Psych    GI/Hepatic   Endo/Other    Renal/GU      Musculoskeletal   Abdominal   Peds  Hematology  (+) anemia ,   Anesthesia Other Findings       Reproductive/Obstetrics (+) Pregnancy                             Anesthesia Physical Anesthesia Plan  ASA: II  Anesthesia Plan: Epidural   Post-op Pain Management:    Induction:   Airway Management Planned:   Additional Equipment:   Intra-op Plan:   Post-operative Plan:   Informed Consent: I have reviewed the patients History and Physical, chart, labs and discussed the procedure including the risks, benefits and alternatives for the proposed anesthesia with the patient or authorized representative who has indicated his/her understanding and acceptance.   Dental Advisory Given  Plan Discussed with:   Anesthesia Plan Comments: (Labs checked- platelets confirmed with RN in room. Fetal heart tracing, per RN, reported to be stable enough for sitting procedure. Discussed epidural, and patient consents to the procedure:  included risk of possible headache,backache, failed block, allergic reaction, and nerve injury. This patient was asked if she had any questions or concerns before the procedure started.)        Anesthesia Quick Evaluation  

## 2013-08-03 NOTE — H&P (Signed)
[name] is a 27 y.o. female presenting for ROM about 2 am. Pregnancy complicated by hx of "stroke" from PFO that was repaired in 2009, no deficits. Preconception consult with cardiology and MFM both recommended no anticoagulation. No HA, no vision change, no epigastric pain. Maternal Medical History:  Reason for admission: Rupture of membranes.   Fetal activity: Perceived fetal activity is normal.      OB History   Grav Para Term Preterm Abortions TAB SAB Ect Mult Living   1              Past Medical History  Diagnosis Date  . Stroke 10/2007  . Kidney stone may 2012  . ANXIETY   . DEPRESSION   . TRANSIENT ISCHEMIC ATTACK   . PATENT FORAMEN OVALE   . Asthma last used inhaler 08/03/13    Seasonal asthma   Past Surgical History  Procedure Laterality Date  . Patent foramen ovale closure  12/2007  . Kidney stone removal  May 2012   Family History: family history includes Heart disease in an other family member. Social History:  reports that she has never smoked. She has never used smokeless tobacco. She reports that  drinks alcohol. She reports that she does not use illicit drugs.   Prenatal Transfer Tool  Maternal Diabetes: No Genetic Screening: Normal Maternal Ultrasounds/Referrals: Normal Fetal Ultrasounds or other Referrals:  None Maternal Substance Abuse:  No Significant Maternal Medications:  Meds include: Other: Wellbutrin Significant Maternal Lab Results:  None Other Comments:  None  Review of Systems  Eyes: Negative for blurred vision.  Gastrointestinal: Negative for abdominal pain.  Neurological: Negative for headaches.    Dilation: 1 Effacement (%): 60 Station: -3 Exam by:: L Lamon RN Blood pressure 113/73, pulse 86, temperature 97.8 F (36.6 C), temperature source Oral, resp. rate 20, height 5' 5.5" (1.664 m), weight 169 lb (76.658 kg), SpO2 99.00%. Maternal Exam:  Uterine Assessment: Contraction strength is firm.  Contraction frequency is  regular.   Abdomen: Patient reports no abdominal tenderness.   Fetal Exam Fetal Monitor Review: Pattern: accelerations present.       Physical Exam  Cardiovascular: Normal rate and regular rhythm.   Respiratory: Effort normal.    Prenatal labs: ABO, Rh: O/Negative/-- (01/31 0000) Antibody: Negative (01/31 0000) Rubella: Immune (01/31 0000) RPR: Nonreactive (01/31 0000)  HBsAg: Negative (01/31 0000)  HIV: Non-reactive (01/31 0000)  GBS: Negative (08/31 0000)   Assessment/Plan: 27 yo G1P0 at 39 2/7 weeks in active labor Pitocin on Epidural in, patient comfortable   Ashlin Kreps II,Beck Cofer E 08/03/2013, 8:32 AM

## 2013-08-03 NOTE — Progress Notes (Signed)
Pt feeling better. Resting without distress

## 2013-08-03 NOTE — Anesthesia Procedure Notes (Signed)

## 2013-08-03 NOTE — Progress Notes (Signed)
Good pain relief FHT reactive UCs q2-3 min

## 2013-08-04 LAB — CBC
HCT: 22.4 % — ABNORMAL LOW (ref 36.0–46.0)
MCV: 83 fL (ref 78.0–100.0)
RBC: 2.7 MIL/uL — ABNORMAL LOW (ref 3.87–5.11)
WBC: 18.1 10*3/uL — ABNORMAL HIGH (ref 4.0–10.5)

## 2013-08-04 MED ORDER — RHO D IMMUNE GLOBULIN 1500 UNIT/2ML IJ SOLN
300.0000 ug | Freq: Once | INTRAMUSCULAR | Status: AC
  Administered 2013-08-04: 300 ug via INTRAMUSCULAR
  Filled 2013-08-04: qty 2

## 2013-08-04 NOTE — Progress Notes (Signed)
Post Partum Day 1 Subjective: no complaints, up ad lib and tolerating PO  Objective: Blood pressure 105/59, pulse 80, temperature 97.3 F (36.3 C), temperature source Oral, resp. rate 20, height 5' 5.5" (1.664 m), weight 76.658 kg (169 lb), SpO2 98.00%, unknown if currently breastfeeding.  Physical Exam:  General: alert and cooperative Lochia: appropriate Uterine Fundus: firm Incision:n/a DVT Evaluation: No evidence of DVT seen on physical exam.   Recent Labs  08/03/13 0255 08/04/13 0600  HGB 9.7* 7.3*  HCT 29.6* 22.4*    Assessment/Plan: Plan for discharge tomorrow and Circumcision prior to discharge   LOS: 1 day   Kynzie Polgar 08/04/2013, 8:21 AM

## 2013-08-04 NOTE — Progress Notes (Signed)
Called to patient's room. Patient in bathroom with husband concerned about blood on pad. (At 2200, had a flat 3 inch diameter clot in toilet when up to void, fundus was 1 below umbilicus with small lochia at that time. Patient had been instructed to call for assistance when she needed to get up.)Lochia on pad scant at this time. Upon standing, patient states she feels weak and lightheaded. Instructed patient to sit back on toilet and called for stedy to wheel patient back to bed. With 2 RN assist and stedy, patient taken back to bed. Patient instructed again to not get up without calling for assistance. Fundus firm at 1 below umbilicus with scant/small lochia. BP stable.  Baby crying at this time and patient proceeds to try to get up out of bed saying she was going to change his diaper. Encouraged patient to lay back and rest and relax as she was feeling weak and dizzy in bathroom. Again instructed patient to not get out of bed without calling for assistance.

## 2013-08-04 NOTE — Anesthesia Postprocedure Evaluation (Signed)
Anesthesia Post Note  Patient: [name]  Procedure(s) Performed: * No procedures listed *  Anesthesia type: Epidural  Patient location: Mother/Baby  Post pain: Pain level controlled  Post assessment: Post-op Vital signs reviewed  Last Vitals:  Filed Vitals:   08/04/13 0622  BP: 105/59  Pulse: 80  Temp: 36.3 C  Resp:     Post vital signs: Reviewed  Level of consciousness:alert  Complications: No apparent anesthesia complications

## 2013-08-04 NOTE — Progress Notes (Signed)
CSW consult noted. See CSW note date 08/01/13. CSW attempted to meet with pt today to reassess situation however FOB is at the bedside. CSW asked RN to call when pt is alone. Depression/anxiety symptoms are being managed by Wellbutrin.       

## 2013-08-05 LAB — RH IG WORKUP (INCLUDES ABO/RH)
Antibody Screen: NEGATIVE
Fetal Screen: NEGATIVE
Unit division: 0

## 2013-08-05 MED ORDER — IBUPROFEN 600 MG PO TABS: 600.0000 mg | ORAL_TABLET | Freq: Four times a day (QID) | ORAL | Status: DC

## 2013-08-05 MED ORDER — FERROUS SULFATE 325 (65 FE) MG PO TABS: 325.0000 mg | ORAL_TABLET | Freq: Three times a day (TID) | ORAL | Status: DC

## 2013-08-05 MED ORDER — FERROUS SULFATE 325 (65 FE) MG PO TABS
325.0000 mg | ORAL_TABLET | Freq: Three times a day (TID) | ORAL | Status: DC
  Administered 2013-08-05: 325 mg via ORAL
  Filled 2013-08-05: qty 1

## 2013-08-05 MED ORDER — OXYCODONE-ACETAMINOPHEN 5-325 MG PO TABS: 1.0000 | ORAL_TABLET | ORAL | Status: DC | PRN

## 2013-08-05 MED ORDER — HYDROXYZINE HCL 50 MG PO TABS: 50.0000 mg | ORAL_TABLET | Freq: Four times a day (QID) | ORAL | Status: DC | PRN

## 2013-08-05 NOTE — Progress Notes (Signed)
CSW spoke with pt briefly about her living situation. Pt plans to discharge home with spouse & states she feels safe in her home. CSW provided pt with resources on 08/01/13 during MAU visit. She does not express any further needs at this time.       

## 2013-08-05 NOTE — Discharge Summary (Signed)
Obstetric Discharge Summary Reason for Admission: rupture of membranes Prenatal Procedures: ultrasound Intrapartum Procedures: spontaneous vaginal delivery Postpartum Procedures: none Complications-Operative and Postpartum: ML episiotomy Hemoglobin  Date Value Range Status  08/04/2013 7.3* 12.0 - 15.0 g/dL Final     DELTA CHECK NOTED     REPEATED TO VERIFY     HCT  Date Value Range Status  08/04/2013 22.4* 36.0 - 46.0 % Final    Physical Exam:  General: alert and cooperative Lochia: appropriate Uterine Fundus: firm Incision: perineum intact DVT Evaluation: No evidence of DVT seen on physical exam. Negative Homan's sign. No cords or calf tenderness. No significant calf/ankle edema.  Discharge Diagnoses: Term Pregnancy-delivered  Discharge Information: Date: 08/05/2013 Activity: pelvic rest Diet: routine Medications: PNV, Ibuprofen, Percocet and Wellbutrin and Vistaril Condition: stable Instructions: refer to practice specific booklet Discharge to: home   Newborn Data: Live born female  Birth Weight: 7 lb 5.4 oz (3328 g) APGAR: 8, 9  Home with mother.  CURTIS,CAROL G 08/05/2013, 8:19 AM

## 2013-08-11 ENCOUNTER — Inpatient Hospital Stay (HOSPITAL_COMMUNITY)
Admission: AD | Admit: 2013-08-11 | Discharge: 2013-08-11 | Disposition: A | Discharge status: Home / Self Care | Payer: Managed Care, Other (non HMO) | Source: Ambulatory Visit | Attending: Obstetrics and Gynecology | Admitting: Obstetrics and Gynecology

## 2013-08-11 ENCOUNTER — Encounter (HOSPITAL_COMMUNITY): Payer: Self-pay | Admitting: *Deleted

## 2013-08-11 DIAGNOSIS — R109 Unspecified abdominal pain: Secondary | ICD-10-CM | POA: Insufficient documentation

## 2013-08-11 DIAGNOSIS — N9489 Other specified conditions associated with female genital organs and menstrual cycle: Secondary | ICD-10-CM

## 2013-08-11 MED ORDER — KETOROLAC TROMETHAMINE 60 MG/2ML IM SOLN
60.0000 mg | Freq: Once | INTRAMUSCULAR | Status: AC
  Administered 2013-08-11: 60 mg via INTRAMUSCULAR
  Filled 2013-08-11: qty 2

## 2013-08-11 NOTE — MAU Provider Note (Signed)
History     CSN: [id number]  Arrival date and time: 08/11/13 2009   None     Chief Complaint  Patient presents with  . Abdominal Pain   HPI Comments: [name] 27 y.o. G2P1001 presents with uterine cramping and bleeding. She was seen in Dr Vance Gather office today for vaginal bleeding. Hgb stable and U/S was normal. She took first dose of Methergine and took nap, after awaking she was in pain, took 2 percocet and called nurse who told her to come to MAU.      Patient is a 27 y.o. female presenting with abdominal pain.  Abdominal Pain The primary symptoms of the illness include abdominal pain.      Past Medical History  Diagnosis Date  . Stroke 10/2007  . Kidney stone may 2012  . ANXIETY   . DEPRESSION   . TRANSIENT ISCHEMIC ATTACK   . PATENT FORAMEN OVALE   . Asthma last used inhaler 08/03/13    Seasonal asthma    Past Surgical History  Procedure Laterality Date  . Patent foramen ovale closure  12/2007  . Kidney stone removal  May 2012    Family History  Problem Relation Age of Onset  . Heart disease      No family history    History  Substance Use Topics  . Smoking status: Never Smoker   . Smokeless tobacco: Never Used  . Alcohol Use: Yes     Comment: Occasional    Allergies: No Known Allergies  Prescriptions prior to admission  Medication Sig Dispense Refill  . buPROPion (WELLBUTRIN XL) 300 MG 24 hr tablet Take 300 mg by mouth daily.      . ferrous sulfate 325 (65 FE) MG tablet Take 1 tablet (325 mg total) by mouth 3 (three) times daily with meals.  90 tablet  3  . hydrOXYzine (ATARAX/VISTARIL) 50 MG tablet Take 1 tablet (50 mg total) by mouth every 6 (six) hours as needed for anxiety.  30 tablet  0  . ibuprofen (ADVIL,MOTRIN) 600 MG tablet Take 1 tablet (600 mg total) by mouth every 6 (six) hours.  30 tablet  1  . oxyCODONE-acetaminophen (PERCOCET/ROXICET) 5-325 MG per tablet Take 1-2 tablets by mouth every 4 (four) hours as needed.  30 tablet  0   . Prenatal Vit-Fe Fumarate-FA (PRENATAL MULTIVITAMIN) TABS Take 1 tablet by mouth every morning.      Marland Kitchen albuterol (PROVENTIL HFA;VENTOLIN HFA) 108 (90 BASE) MCG/ACT inhaler Inhale 2 puffs into the lungs every 6 (six) hours as needed for wheezing.        Review of Systems  Constitutional: Negative.   HENT: Negative.   Respiratory: Negative.   Cardiovascular: Negative.   Gastrointestinal: Positive for abdominal pain.  Genitourinary:       Vaginal bleeding  Musculoskeletal: Negative.   Skin: Negative.   Neurological: Negative.   Psychiatric/Behavioral: The patient is nervous/anxious.    Physical Exam   Blood pressure 87/62, pulse 71, temperature 97.9 F (36.6 C), temperature source Oral, resp. rate 18, height 5\' 5"  (1.651 m), weight 151 lb (68.493 kg), unknown if currently breastfeeding.  Physical Exam  Constitutional: She is oriented to person, place, and time. She appears well-developed and well-nourished. No distress.  HENT:  Head: Normocephalic.  Eyes: Pupils are equal, round, and reactive to light.  GI: Soft. She exhibits no distension and no mass. There is no tenderness. There is no rebound and no guarding.  Genitourinary:  Vaginal bleeding moderate with clots  Musculoskeletal: Normal range of motion.  Neurological: She is alert and oriented to person, place, and time.  Skin: Skin is warm and dry.  Psychiatric: Her mood appears anxious.    MAU Course  Procedures  MDM Toradol 60 mg IM pain went to 0/10  Assessment and Plan   A: Postpartum bleeding/ cramping with Methergine  P: Toradol 60 mg IM now Add Motrin to pain management Follow up with Dr Eda Paschal, Rubbie Battiest 08/11/2013, 9:12 PM

## 2013-08-11 NOTE — MAU Note (Signed)
C/o increased abdominal cramping;

## 2013-08-11 NOTE — MAU Note (Signed)
C/o increased cramping and bleeding; seen at OB's office today and given methergine; cramping is constant and severe-pt can hardly stand up straight and walk; has been bleeding since delivery on 08-03-13;breastfeeding; has been waking baby up around the clock every 3 hours to feed;

## 2013-08-11 NOTE — MAU Note (Addendum)
PT SAYS SHE   VAG DEL  ON 8-31-  BY DR TOMBLIN.      BREAST/ BOTTLE FEEDING.   EVERYTHING OK UNTIL TODAY-  EXCEPT FOR BLEEDING- SINCE SHE DEL.   ON Friday- PASSED BASEBALL SIZE CLOTS.   TO DR TODAY-  U/S-   ALL GOOD,   TESTED HGB  - 9.      GAVE HER MED FOR BLEEDING - METHERGINE-  TOOK AT 5 PM.   THEN SHE TOOK A NAP AND AWOKE  AT 630PM   WITH  ABD PAIN.  SHE TOOK  2 PERCOCET AT  745PM.    NOW SHE FEELS SOME RELIEF.    SAYS SHE IS STILL BLEEDING HEAVY    .  SAYS NOW SHE HAS DULL PAIN IN ABD.

## 2013-08-20 ENCOUNTER — Ambulatory Visit: Payer: 59 | Admitting: Cardiology

## 2013-08-28 ENCOUNTER — Encounter: Payer: Self-pay | Admitting: Cardiology

## 2013-11-17 ENCOUNTER — Other Ambulatory Visit (HOSPITAL_COMMUNITY): Payer: Self-pay | Admitting: Family Medicine

## 2013-11-17 DIAGNOSIS — E0590 Thyrotoxicosis, unspecified without thyrotoxic crisis or storm: Secondary | ICD-10-CM

## 2013-12-08 ENCOUNTER — Encounter (HOSPITAL_COMMUNITY): Payer: Self-pay

## 2013-12-08 ENCOUNTER — Encounter (HOSPITAL_COMMUNITY)
Admission: RE | Admit: 2013-12-08 | Discharge: 2013-12-08 | Disposition: A | Discharge status: Home / Self Care | Payer: Managed Care, Other (non HMO) | Source: Ambulatory Visit | Attending: Family Medicine | Admitting: Family Medicine

## 2013-12-08 DIAGNOSIS — E0590 Thyrotoxicosis, unspecified without thyrotoxic crisis or storm: Secondary | ICD-10-CM | POA: Insufficient documentation

## 2013-12-09 ENCOUNTER — Encounter (HOSPITAL_COMMUNITY)
Admission: RE | Admit: 2013-12-09 | Discharge: 2013-12-09 | Disposition: A | Discharge status: Home / Self Care | Payer: Managed Care, Other (non HMO) | Source: Ambulatory Visit | Attending: Family Medicine | Admitting: Family Medicine

## 2013-12-09 MED ORDER — SODIUM IODIDE I 131 CAPSULE
12.0000 | Freq: Once | INTRAVENOUS | Status: AC | PRN
  Administered 2013-12-09: 12 via ORAL

## 2013-12-09 MED ORDER — SODIUM PERTECHNETATE TC 99M INJECTION
11.0000 | Freq: Once | INTRAVENOUS | Status: AC | PRN
  Administered 2013-12-09: 11 via INTRAVENOUS

## 2014-05-08 ENCOUNTER — Other Ambulatory Visit (HOSPITAL_COMMUNITY): Payer: Self-pay | Admitting: Cardiology

## 2014-05-08 DIAGNOSIS — R0602 Shortness of breath: Secondary | ICD-10-CM

## 2014-05-14 ENCOUNTER — Ambulatory Visit (HOSPITAL_BASED_OUTPATIENT_CLINIC_OR_DEPARTMENT_OTHER)
Admission: RE | Admit: 2014-05-14 | Discharge: 2014-05-14 | Disposition: A | Discharge status: Home / Self Care | Payer: Managed Care, Other (non HMO) | Source: Ambulatory Visit | Attending: Internal Medicine | Admitting: Internal Medicine

## 2014-05-14 ENCOUNTER — Ambulatory Visit (HOSPITAL_COMMUNITY)
Admission: RE | Admit: 2014-05-14 | Discharge: 2014-05-14 | Disposition: A | Discharge status: Home / Self Care | Payer: Managed Care, Other (non HMO) | Source: Ambulatory Visit | Attending: Family Medicine | Admitting: Family Medicine

## 2014-05-14 VITALS — BP 114/70 | HR 79 | Wt 119.5 lb

## 2014-05-14 DIAGNOSIS — R5382 Chronic fatigue, unspecified: Secondary | ICD-10-CM | POA: Insufficient documentation

## 2014-05-14 DIAGNOSIS — R0989 Other specified symptoms and signs involving the circulatory and respiratory systems: Secondary | ICD-10-CM

## 2014-05-14 DIAGNOSIS — I369 Nonrheumatic tricuspid valve disorder, unspecified: Secondary | ICD-10-CM

## 2014-05-14 DIAGNOSIS — R0609 Other forms of dyspnea: Secondary | ICD-10-CM

## 2014-05-14 DIAGNOSIS — R0602 Shortness of breath: Secondary | ICD-10-CM

## 2014-05-14 DIAGNOSIS — R5381 Other malaise: Secondary | ICD-10-CM

## 2014-05-14 DIAGNOSIS — R5383 Other fatigue: Secondary | ICD-10-CM

## 2014-05-14 DIAGNOSIS — R0600 Dyspnea, unspecified: Secondary | ICD-10-CM | POA: Insufficient documentation

## 2014-05-14 NOTE — Patient Instructions (Signed)
Your physician has recommended that you have a cardiopulmonary stress test (CPX). CPX testing is a non-invasive measurement of heart and lung function. It replaces a traditional treadmill stress test. This type of test provides a tremendous amount of information that relates not only to your present condition but also for future outcomes. This test combines measurements of you ventilation, respiratory gas exchange in the lungs, electrocardiogram (EKG), blood pressure and physical response before, during, and following an exercise protocol.

## 2014-05-14 NOTE — Addendum Note (Signed)
Encounter addended by: Noralee Space, RN on: 05/14/2014 11:08 AM<BR>     Documentation filed: Patient Instructions Section, Orders

## 2014-05-14 NOTE — Progress Notes (Signed)
Cardiology Consult Note  Patient ID: RANIKA HAMBLEY, female   DOB: [birth year], 28 y.o.   MRN: [id number]  Referring Physician: Donata Clay Reason: Exertional dyspnea  Weight Range: Baseline proBNP:  HPI:  Catelin is a 28 y/o woman with a h/o seasonal asthma, PFO s/p closure [facility] 2009), TIA, anxiety/depression who is referred by Dr. Donata Clay for further evaluation of exertional dyspnea.   She has previously was seen by Dr. Jens Som in 2013 when she was contemplating pregnancy.  She had echocardiogram in February of 2010 revealed normal LV function and a bubble study was negative indicating no residual shunt. Dr. Jens Som repeated her echo in 8/13 which showed EF 60-65% normal RV and valves. Bubble study negative.   Says she had a lot of dyspnea during her pregnancy. Early post-partum had a lot of fatigue found to have hypothyroidism. Saw Debara Pickett and started on synthroid. TFTs are now normal. Remains very fatigued at all times. Feels tired and dyspneic just walking to mailbox. Has been very active in past. 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 Says she has bad days and worse days. But no real good days. She tries to exercise but says she just can't do it. No dyspnea at rest. No orthopnea, PND or edema. No fevers, chills, weight loss or weight gain. Had a rash on stomach and arms 4 weeks ago but resolved. Joints hurt occasionally. No active wheezing. No cyanosis. Has been sleeping very well. Her baby sleeps through the night.   On Wellbutrin for years. Doing well. Denies depressive symptoms.   Has had an extensive endocrine work-up by Dr. Sharl Ma and nothing has turned up.   Echo today EF 50-55% normal RV. Normal diastolic parameters. No effusion.   Review of Systems: [y] = yes, [ ]  = no   General: Weight gain [ ] ; Weight loss [ ] ; Anorexia [ ] ; Fatigue Cove.Etienne ]; Fever [ ] ; Chills [ ] ; Weakness Cove.Etienne ]  Cardiac: Chest pain/pressure [ ] ; Resting SOB [ ] ; Exertional SOB [ y]; Orthopnea [ ] ; Pedal  Edema [ ] ; Palpitations [ ] ; Syncope [ ] ; Presyncope [ ] ; Paroxysmal nocturnal dyspnea[ ]   Pulmonary: Cough [ ] ; Wheezing[ ] ; Hemoptysis[ ] ; Sputum [ ] ; Snoring [ ]   GI: Vomiting[ ] ; Dysphagia[ ] ; Melena[ ] ; Hematochezia [ ] ; Heartburn[ ] ; Abdominal pain [ ] ; Constipation [ ] ; Diarrhea [ ] ; BRBPR [ ]   GU: Hematuria[ ] ; Dysuria [ ] ; Nocturia[ ]   Vascular: Pain in legs with walking [ ] ; Pain in feet with lying flat [ ] ; Non-healing sores [ ] ; Stroke [ ] ; TIA [ ] ; Slurred speech [ ] ;  Neuro: Headaches[ ] ; Vertigo[ ] ; Seizures[ ] ; Paresthesias[ ] ;Blurred vision [ ] ; Diplopia [ ] ; Vision changes [ ]   Ortho/Skin: Arthritis [ ] ; Joint pain Cove.Etienne ]; Muscle pain [ ] ; Joint swelling [ ] ; Back Pain [ ] ; Rash [ y]  Psych: Depression[ ] ; Anxiety[ ]   Heme: Bleeding problems [ ] ; Clotting disorders [ ] ; Anemia [ ]   Endocrine: Diabetes [ ] ; Thyroid dysfunction[y ]   Past Medical History  Diagnosis Date  . Stroke 10/2007  . Kidney stone may 2012  . ANXIETY   . DEPRESSION   . TRANSIENT ISCHEMIC ATTACK   . PATENT FORAMEN OVALE   . Asthma last used inhaler 08/03/13    Seasonal asthma    Current Outpatient Prescriptions  Medication Sig Dispense Refill  . albuterol (PROVENTIL HFA;VENTOLIN HFA) 108 (90 BASE) MCG/ACT inhaler Inhale 2 puffs into the lungs every  6 (six) hours as needed for wheezing.      Marland Kitchen buPROPion (WELLBUTRIN XL) 300 MG 24 hr tablet Take 300 mg by mouth daily.      Marland Kitchen ibuprofen (ADVIL,MOTRIN) 600 MG tablet Take 1 tablet (600 mg total) by mouth every 6 (six) hours.  30 tablet  1  . levothyroxine (SYNTHROID, LEVOTHROID) 75 MCG tablet Take 75 mcg by mouth daily before breakfast.       No current facility-administered medications for this encounter.    No Known Allergies  History   Social History  . Marital Status: Married    Spouse Name: N/A    Number of Children: N/A  . Years of Education: N/A   Occupational History  . Not on file.   Social History Main Topics  . Smoking status:  Never Smoker   . Smokeless tobacco: Never Used  . Alcohol Use: Yes     Comment: Occasional  . Drug Use: No  . Sexual Activity: Yes    Partners: Male    Birth Control/ Protection: None   Other Topics Concern  . Not on file   Social History Narrative  . No narrative on file    Family History  Problem Relation Age of Onset  . Heart disease      No family history    PHYSICAL EXAM: Filed Vitals:   05/14/14 1014  BP: 114/70  Pulse: 79  Weight: 119 lb 8 oz (54.205 kg)  SpO2: 99%    General:  Well appearing. Thin. No respiratory difficulty HEENT: normal Neck: supple. no JVD. Carotids 2+ bilat; no bruits. No lymphadenopathy or thryomegaly appreciated. Cor: PMI nondisplaced. Regular rate & rhythm. No rubs, gallops or murmurs. Lungs: clear Abdomen: soft, nontender, nondistended. No hepatosplenomegaly. No bruits or masses. Good bowel sounds. Extremities: no cyanosis, clubbing, rash, edema Neuro: alert & oriented x 3, cranial nerves grossly intact. moves all 4 extremities w/o difficulty. Affect pleasant.  ECG: NSR 85 No ST-T wave abnormalities.     ASSESSMENT & PLAN: 1. Exertional dyspnea 2. Chronic fatigue 3. Hypothyroidism, treated  This is a tough case. She has profound fatigue and exertional dyspnea for the past several months. Initially though to be due to her thyroid but this has been treated and her symptoms persist. I do not see any evidence of post-partum depression or other common issues. She has previously been very active and thus rules out problems like inborn errors of metabolism or glycogen storage disease. She has had an extensive endocrine w/u which has been unrevealing. I looked at her echo today and this is normal. I suspect she likely has a chronic fatigue syndrome which may be viral in nature but it is very persistent.  I think our job is to make sure she doesn't have a cardiopulmonary defect and the next step in her work-up should be a CPX test if she can  complete it. i will discuss with Dr. Sharl Ma to see if we can come up with any other thoughts together.  Abbegale Stehle,MD 11:03 AM

## 2014-05-14 NOTE — Progress Notes (Signed)
  Echocardiogram 2D Echocardiogram has been performed.  Georgian Co 05/14/2014, 9:49 AM

## 2014-05-15 NOTE — Addendum Note (Signed)
Encounter addended by: Simon RheinSandra C Susie Pousson, CCT on: 05/15/2014  9:24 AM<BR>     Documentation filed: Charges VN

## 2014-05-25 ENCOUNTER — Ambulatory Visit (HOSPITAL_COMMUNITY): Payer: Managed Care, Other (non HMO) | Attending: Internal Medicine

## 2014-05-25 DIAGNOSIS — R5382 Chronic fatigue, unspecified: Secondary | ICD-10-CM

## 2014-05-25 DIAGNOSIS — R0609 Other forms of dyspnea: Secondary | ICD-10-CM

## 2014-05-25 DIAGNOSIS — R0602 Shortness of breath: Secondary | ICD-10-CM | POA: Insufficient documentation

## 2014-05-25 DIAGNOSIS — R0989 Other specified symptoms and signs involving the circulatory and respiratory systems: Secondary | ICD-10-CM

## 2014-05-25 DIAGNOSIS — R0600 Dyspnea, unspecified: Secondary | ICD-10-CM

## 2014-06-01 ENCOUNTER — Telehealth (HOSPITAL_COMMUNITY): Payer: Self-pay | Admitting: Vascular Surgery

## 2014-06-01 NOTE — Telephone Encounter (Signed)
Pt would like a call about CPX results

## 2014-06-02 NOTE — Telephone Encounter (Signed)
Will send to DB to call 

## 2014-06-02 NOTE — Telephone Encounter (Signed)
i called and left her VM discussing results.

## 2014-10-05 ENCOUNTER — Encounter (HOSPITAL_COMMUNITY): Payer: Self-pay

## 2015-01-12 ENCOUNTER — Ambulatory Visit
Admission: RE | Admit: 2015-01-12 | Discharge: 2015-01-12 | Disposition: A | Discharge status: Home / Self Care | Payer: BLUE CROSS/BLUE SHIELD | Source: Ambulatory Visit | Attending: Internal Medicine | Admitting: Internal Medicine

## 2015-01-12 ENCOUNTER — Other Ambulatory Visit: Payer: Self-pay | Admitting: Internal Medicine

## 2015-01-12 DIAGNOSIS — R0602 Shortness of breath: Secondary | ICD-10-CM

## 2015-02-04 ENCOUNTER — Other Ambulatory Visit: Payer: Self-pay | Admitting: Family Medicine

## 2015-02-04 DIAGNOSIS — R0600 Dyspnea, unspecified: Secondary | ICD-10-CM

## 2015-02-05 ENCOUNTER — Ambulatory Visit (INDEPENDENT_AMBULATORY_CARE_PROVIDER_SITE_OTHER): Payer: BLUE CROSS/BLUE SHIELD | Admitting: Internal Medicine

## 2015-02-05 ENCOUNTER — Encounter (INDEPENDENT_AMBULATORY_CARE_PROVIDER_SITE_OTHER): Payer: Self-pay

## 2015-02-05 DIAGNOSIS — R0600 Dyspnea, unspecified: Secondary | ICD-10-CM

## 2015-02-05 LAB — PULMONARY FUNCTION TEST
DL/VA % pred: 96 %
DL/VA: 4.99 ml/min/mmHg/L
DLCO unc % pred: 99 %
DLCO unc: 28.29 ml/min/mmHg
FEF 25-75 POST: 3.08 L/s
FEF 25-75 PRE: 1.91 L/s
FEF2575-%Change-Post: 61 %
FEF2575-%PRED-POST: 84 %
FEF2575-%Pred-Pre: 52 %
FEV1-%Change-Post: 15 %
FEV1-%Pred-Post: 94 %
FEV1-%Pred-Pre: 81 %
FEV1-POST: 3.28 L
FEV1-PRE: 2.84 L
FEV1FVC-%CHANGE-POST: 16 %
FEV1FVC-%Pred-Pre: 84 %
FEV6-%CHANGE-POST: 0 %
FEV6-%Pred-Post: 96 %
FEV6-%Pred-Pre: 97 %
FEV6-POST: 3.98 L
FEV6-Pre: 3.99 L
FEV6FVC-%PRED-POST: 101 %
FEV6FVC-%Pred-Pre: 101 %
FVC-%CHANGE-POST: 0 %
FVC-%Pred-Post: 95 %
FVC-%Pred-Pre: 96 %
FVC-Post: 3.98 L
FVC-Pre: 3.99 L
PRE FEV1/FVC RATIO: 71 %
Post FEV1/FVC ratio: 82 %
Post FEV6/FVC ratio: 100 %
Pre FEV6/FVC Ratio: 100 %
RV % pred: 112 %
RV: 1.72 L
TLC % pred: 99 %
TLC: 5.45 L

## 2015-02-05 NOTE — Progress Notes (Signed)
PFT done today. 

## 2015-10-18 ENCOUNTER — Encounter (HOSPITAL_COMMUNITY): Payer: Self-pay | Admitting: Emergency Medicine

## 2015-10-18 ENCOUNTER — Emergency Department (HOSPITAL_COMMUNITY): Payer: BLUE CROSS/BLUE SHIELD

## 2015-10-18 ENCOUNTER — Emergency Department (HOSPITAL_COMMUNITY)
Admission: EM | Admit: 2015-10-18 | Discharge: 2015-10-18 | Disposition: A | Discharge status: Home / Self Care | Payer: BLUE CROSS/BLUE SHIELD | Attending: Emergency Medicine | Admitting: Emergency Medicine

## 2015-10-18 DIAGNOSIS — Z87442 Personal history of urinary calculi: Secondary | ICD-10-CM | POA: Insufficient documentation

## 2015-10-18 DIAGNOSIS — Z8774 Personal history of (corrected) congenital malformations of heart and circulatory system: Secondary | ICD-10-CM | POA: Insufficient documentation

## 2015-10-18 DIAGNOSIS — F419 Anxiety disorder, unspecified: Secondary | ICD-10-CM | POA: Insufficient documentation

## 2015-10-18 DIAGNOSIS — J45909 Unspecified asthma, uncomplicated: Secondary | ICD-10-CM | POA: Insufficient documentation

## 2015-10-18 DIAGNOSIS — F410 Panic disorder [episodic paroxysmal anxiety] without agoraphobia: Secondary | ICD-10-CM | POA: Diagnosis not present

## 2015-10-18 DIAGNOSIS — Z79899 Other long term (current) drug therapy: Secondary | ICD-10-CM | POA: Diagnosis not present

## 2015-10-18 DIAGNOSIS — Z8673 Personal history of transient ischemic attack (TIA), and cerebral infarction without residual deficits: Secondary | ICD-10-CM | POA: Diagnosis not present

## 2015-10-18 DIAGNOSIS — F329 Major depressive disorder, single episode, unspecified: Secondary | ICD-10-CM | POA: Diagnosis not present

## 2015-10-18 DIAGNOSIS — R079 Chest pain, unspecified: Secondary | ICD-10-CM | POA: Diagnosis present

## 2015-10-18 LAB — CBC
HCT: 39.5 % (ref 36.0–46.0)
Hemoglobin: 13.4 g/dL (ref 12.0–15.0)
MCH: 30.9 pg (ref 26.0–34.0)
MCHC: 33.9 g/dL (ref 30.0–36.0)
MCV: 91 fL (ref 78.0–100.0)
PLATELETS: 247 10*3/uL (ref 150–400)
RBC: 4.34 MIL/uL (ref 3.87–5.11)
RDW: 11.9 % (ref 11.5–15.5)
WBC: 5.1 10*3/uL (ref 4.0–10.5)

## 2015-10-18 LAB — RAPID URINE DRUG SCREEN, HOSP PERFORMED
Amphetamines: NOT DETECTED
BARBITURATES: NOT DETECTED
Benzodiazepines: NOT DETECTED
COCAINE: NOT DETECTED
OPIATES: NOT DETECTED
Tetrahydrocannabinol: NOT DETECTED

## 2015-10-18 LAB — BASIC METABOLIC PANEL
Anion gap: 8 (ref 5–15)
BUN: 9 mg/dL (ref 6–20)
CALCIUM: 9 mg/dL (ref 8.9–10.3)
CO2: 25 mmol/L (ref 22–32)
CREATININE: 0.73 mg/dL (ref 0.44–1.00)
Chloride: 105 mmol/L (ref 101–111)
GFR calc non Af Amer: >60 mL/min (ref >60)
Glucose, Bld: 95 mg/dL (ref 65–99)
Potassium: 3.3 mmol/L — ABNORMAL LOW (ref 3.5–5.1)
SODIUM: 138 mmol/L (ref 135–145)

## 2015-10-18 LAB — I-STAT TROPONIN, ED: TROPONIN I, POC: 0 ng/mL (ref 0.00–0.08)

## 2015-10-18 NOTE — ED Provider Notes (Signed)
CSN: [id number]     Arrival date & time 10/18/15  1108 History   First MD Initiated Contact with Patient 10/18/15 1140     Chief Complaint  Patient presents with  . Chest Pain     (Consider location/radiation/quality/duration/timing/severity/associated sxs/prior Treatment) HPI   [name] is a 29 y.o. female with PMH significant for anxiety, depression, TIA secondary to PFO (closed 2009), nephrolithiasis who presents with left sided throbbing chest pain at 8:45 AM that lasted approximately 30 minutes.  She states that the pain has subsided, and that now she just feels chest pressure.  Tried her albuterol inhaler.  Nothing makes it better or worse.  Denies tobacco use.  Assoc symptoms include SOB, difficulty speaking and feeling disoriented.  Denies slurred speech, facial droop, leg swelling, fevers, abdominal pain.  No hx of DVT/PE.  No recent surgeries.    Past Medical History  Diagnosis Date  . Stroke (HCC) 10/2007  . Kidney stone may 2012  . ANXIETY   . DEPRESSION   . TRANSIENT ISCHEMIC ATTACK   . PATENT FORAMEN OVALE   . Asthma last used inhaler 08/03/13    Seasonal asthma   Past Surgical History  Procedure Laterality Date  . Patent foramen ovale closure  12/2007  . Kidney stone removal  May 2012   Family History  Problem Relation Age of Onset  . Heart disease      No family history   Social History  Substance Use Topics  . Smoking status: Never Smoker   . Smokeless tobacco: Never Used  . Alcohol Use: Yes     Comment: Occasional   OB History    Gravida Para Term Preterm AB TAB SAB Ectopic Multiple Living   Review of Systems All other systems negative unless otherwise stated in HPI    Allergies  Review of patient's allergies indicates no known allergies.  Home Medications   Prior to Admission medications   Medication Sig Start Date End Date Taking? Authorizing Provider  albuterol (PROVENTIL HFA;VENTOLIN HFA) 108 (90 BASE) MCG/ACT  inhaler Inhale 2 puffs into the lungs every 6 (six) hours as needed for wheezing.    Historical Provider, MD  buPROPion (WELLBUTRIN XL) 300 MG 24 hr tablet Take 300 mg by mouth daily.    Historical Provider, MD  ibuprofen (ADVIL,MOTRIN) 600 MG tablet Take 1 tablet (600 mg total) by mouth every 6 (six) hours. 08/05/13   Julio Sicks, NP  levothyroxine (SYNTHROID, LEVOTHROID) 75 MCG tablet Take 75 mcg by mouth daily before breakfast.    Historical Provider, MD   BP 102/61 mmHg  Pulse 70  Temp(Src) 98.1 F (36.7 C) (Oral)  Resp 16  SpO2 100%  LMP 10/18/2015 Physical Exam  Constitutional: She is oriented to person, place, and time. She appears well-developed and well-nourished.  She appears anxious.  HENT:  Head: Normocephalic and atraumatic.  Mouth/Throat: Oropharynx is clear and moist.  Eyes: Conjunctivae are normal. Pupils are equal, round, and reactive to light.  Neck: Normal range of motion. Neck supple.  Cardiovascular: Normal rate, regular rhythm and normal heart sounds.   No murmur heard. Pulses:      Radial pulses are 2+ on the right side, and 2+ on the left side.       Posterior tibial pulses are 2+ on the right side, and 2+ on the left side.  No lower extremity edema.  Pulmonary/Chest: Effort normal and  breath sounds normal. No accessory muscle usage or stridor. No respiratory distress. She has no wheezes. She has no rhonchi. She has no rales.  Abdominal: Soft. Bowel sounds are normal. She exhibits no distension. There is no tenderness.  Musculoskeletal: Normal range of motion.  Lymphadenopathy:    She has no cervical adenopathy.  Neurological: She is alert and oriented to person, place, and time.  Speech clear without dysarthria.  Skin: Skin is warm and dry.  Psychiatric: She has a normal mood and affect. Her behavior is normal.    ED Course  Procedures (including critical care time) Labs Review Labs Reviewed  BASIC METABOLIC PANEL - Abnormal; Notable for the following:     Potassium 3.3 (*)    All other components within normal limits  CBC  URINE RAPID DRUG SCREEN, HOSP PERFORMED  I-STAT TROPOININ, ED    Imaging Review Dg Chest 2 View  10/18/2015  CLINICAL DATA:  Pain in chest for 1 hour EXAM: CHEST  2 VIEW COMPARISON:  01/12/2015 FINDINGS: PFO closure device noted. The heart size and mediastinal contours are within normal limits. Both lungs are clear. The visualized skeletal structures are unremarkable. IMPRESSION: No active cardiopulmonary disease. Electronically Signed   By: Signa Kellaylor  Stroud M.D.   On: 10/18/2015 12:02   I have personally reviewed and evaluated these images and lab results as part of my medical decision-making.   EKG Interpretation None      MDM   Final diagnoses:  Panic attack    Patient presents with 30 minute episode of left sided nonradiating throbbing chest pain at 8:45 AM.  Endorses SOB and chest pressure at this time.  On exam, heart RRR, lungs CTAB, abdomen soft and benign.  No unilateral lower extremity edema.  EKG rhythm strip shows NSR.  VSS, she appear anxious.  Will obtain labs.  PERC negative.  Suspect anxiety driven. Will add on TSH, she will follow up with her endocrinologist.  EKG shows NSR.  Troponin 0.00. CXR negative. Doubt cardiac etiology.  BMP and CBC shows potassium 3.3; otherwise unremarkable.  Doubt anemia.  UDS negative   Case has been discussed with and seen by Dr. [name] who agrees with the above plan for discharge.       Cheri FowlerKayla Carlicia Leavens, PA-C 10/18/15 1622  Rolland PorterMark James, MD 10/27/15 [phone]

## 2015-10-18 NOTE — ED Notes (Signed)
Pt transported to DG at present time; will assess and attempt IV/blood draw with pt return.

## 2015-10-18 NOTE — ED Provider Notes (Signed)
Pt seen and evaluated.  Discussed symptoms at length.  History and prior studies reviewed. Pt does not describe focal sx to suggest TIA/CVA.  Now feels "just worn out".  No ricks for DVT/PE.  Currently o/w asymptomatic.  Appropriate for DC.  Will add TSH as pt set for endocrine f/u in 3 weeks.  Rolland PorterMark Raheem Kolbe, MD 10/18/15 [phone]

## 2015-10-18 NOTE — ED Notes (Signed)
Per [name] pt refuses additional blood to be drawn from IV site or new stick because "she is too light headed." Rose and [name] aware; [name] reports will be at bedside momentarily.

## 2015-10-18 NOTE — ED Notes (Addendum)
Patient c/o upper chest pain x1 hour described as dull, with SOB and anxiety. Hx of CVA. Denies lightheadedness, dizziness, diaphoresis. No focal neuro deficits.

## 2015-10-18 NOTE — ED Notes (Signed)
Nurse at bedside starting IV, will collect lab

## 2015-10-18 NOTE — Discharge Instructions (Signed)
Klonopin as needed with any recurrence of symptoms.   Panic Attacks Panic attacks are sudden, short-livedsurges of severe anxiety, fear, or discomfort. They may occur for no reason when you are relaxed, when you are anxious, or when you are sleeping. Panic attacks may occur for a number of reasons:   Healthy people occasionally have panic attacks in extreme, life-threatening situations, such as war or natural disasters. Normal anxiety is a protective mechanism of the body that helps us react to danger (fight or flight response).  Panic attacks are often seen with anxiety disorders, such as panic disorder, social anxiety disorder, generalized anxiety disorder, and phobias. Anxiety disorders cause excessive or uncontrollable anxiety. They may interfere with your relationships or other life activities.  Panic attacks are sometimes seen with other mental illnesses, such as depression and posttraumatic stress disorder.  Certain medical conditions, prescription medicines, and drugs of abuse can cause panic attacks. SYMPTOMS  Panic attacks start suddenly, peak within 20 minutes, and are accompanied by four or more of the following symptoms:  Pounding heart or fast heart rate (palpitations).  Sweating.  Trembling or shaking.  Shortness of breath or feeling smothered.  Feeling choked.  Chest pain or discomfort.  Nausea or strange feeling in your stomach.  Dizziness, light-headedness, or feeling like you will faint.  Chills or hot flushes.  Numbness or tingling in your lips or hands and feet.  Feeling that things are not real or feeling that you are not yourself.  Fear of losing control or going crazy.  Fear of dying. Some of these symptoms can mimic serious medical conditions. For example, you may think you are having a heart attack. Although panic attacks can be very scary, they are not life threatening. DIAGNOSIS  Panic attacks are diagnosed through an assessment by your health  care provider. Your health care provider will ask questions about your symptoms, such as where and when they occurred. Your health care provider will also ask about your medical history and use of alcohol and drugs, including prescription medicines. Your health care provider may order blood tests or other studies to rule out a serious medical condition. Your health care provider may refer you to a mental health professional for further evaluation. TREATMENT   Most healthy people who have one or two panic attacks in an extreme, life-threatening situation will not require treatment.  The treatment for panic attacks associated with anxiety disorders or other mental illness typically involves counseling with a mental health professional, medicine, or a combination of both. Your health care provider will help determine what treatment is best for you.  Panic attacks due to physical illness usually go away with treatment of the illness. If prescription medicine is causing panic attacks, talk with your health care provider about stopping the medicine, decreasing the dose, or substituting another medicine.  Panic attacks due to alcohol or drug abuse go away with abstinence. Some adults need professional help in order to stop drinking or using drugs. HOME CARE INSTRUCTIONS   Take all medicines as directed by your health care provider.   Schedule and attend follow-up visits as directed by your health care provider. It is important to keep all your appointments. SEEK MEDICAL CARE IF:  You are not able to take your medicines as prescribed.  Your symptoms do not improve or get worse. SEEK IMMEDIATE MEDICAL CARE IF:   You experience panic attack symptoms that are different than your usual symptoms.  You have serious thoughts about hurting yourself or  others.  You are taking medicine for panic attacks and have a serious side effect. MAKE SURE YOU:  Understand these instructions.  Will watch your  condition.  Will get help right away if you are not doing well or get worse.   This information is not intended to replace advice given to you by your health care provider. Make sure you discuss any questions you have with your health care provider.   Document Released: 11/20/2005 Document Revised: 11/25/2013 Document Reviewed: 07/04/2013 Elsevier Interactive Patient Education [employer]

## 2015-10-18 NOTE — ED Notes (Signed)
MD at bedside. 

## 2016-02-25 ENCOUNTER — Other Ambulatory Visit (HOSPITAL_COMMUNITY)
Admission: RE | Admit: 2016-02-25 | Discharge: 2016-02-25 | Disposition: A | Discharge status: Home / Self Care | Payer: Commercial Managed Care - HMO | Source: Ambulatory Visit | Attending: Obstetrics & Gynecology | Admitting: Obstetrics & Gynecology

## 2016-02-25 ENCOUNTER — Other Ambulatory Visit: Payer: Self-pay | Admitting: Obstetrics & Gynecology

## 2016-02-25 DIAGNOSIS — Z01419 Encounter for gynecological examination (general) (routine) without abnormal findings: Secondary | ICD-10-CM | POA: Diagnosis present

## 2016-02-25 DIAGNOSIS — Z1151 Encounter for screening for human papillomavirus (HPV): Secondary | ICD-10-CM | POA: Insufficient documentation

## 2016-02-28 LAB — CYTOLOGY - PAP

## 2016-06-08 LAB — OB RESULTS CONSOLE ABO/RH: RH Type: NEGATIVE

## 2016-06-08 LAB — OB RESULTS CONSOLE ANTIBODY SCREEN: Antibody Screen: NEGATIVE

## 2016-06-08 LAB — OB RESULTS CONSOLE RPR: RPR: NONREACTIVE

## 2016-06-08 LAB — OB RESULTS CONSOLE HIV ANTIBODY (ROUTINE TESTING): HIV: NONREACTIVE

## 2016-06-08 LAB — OB RESULTS CONSOLE RUBELLA ANTIBODY, IGM: RUBELLA: IMMUNE

## 2016-06-08 LAB — OB RESULTS CONSOLE HEPATITIS B SURFACE ANTIGEN: HEP B S AG: NEGATIVE

## 2016-09-29 ENCOUNTER — Institutional Professional Consult (permissible substitution): Payer: BLUE CROSS/BLUE SHIELD | Admitting: Pulmonary Disease

## 2016-10-18 ENCOUNTER — Institutional Professional Consult (permissible substitution): Payer: BLUE CROSS/BLUE SHIELD | Admitting: Pulmonary Disease

## 2016-12-04 NOTE — L&D Delivery Note (Signed)
Delivery Note At 4:03 PM a viable female was delivered via Vaginal, Spontaneous Delivery (Presentation: LOA;  ).  APGAR: 9, 9; weight  pending Placenta status: to L&D.  Cord:  with the following complications: none  Anesthesia:  epidural Episiotomy: None Lacerations: 2nd degree Suture Repair: 2.0 3.0 vicryl Est. Blood Loss (mL):  300cc  Mom to postpartum.  Baby to Couplet care / Skin to Skin.  Myna HidalgoZAN, Shanvi Moyd, M 01/05/2017, 4:31 PM

## 2017-01-05 ENCOUNTER — Inpatient Hospital Stay (HOSPITAL_COMMUNITY): Payer: Managed Care, Other (non HMO) | Admitting: Anesthesiology

## 2017-01-05 ENCOUNTER — Inpatient Hospital Stay (HOSPITAL_COMMUNITY)
Admission: AD | Admit: 2017-01-05 | Discharge: 2017-01-06 | DRG: 775 | Disposition: A | Discharge status: Home / Self Care | Payer: Managed Care, Other (non HMO) | Source: Ambulatory Visit | Attending: Obstetrics & Gynecology | Admitting: Obstetrics & Gynecology

## 2017-01-05 ENCOUNTER — Encounter (HOSPITAL_COMMUNITY): Payer: Self-pay | Admitting: *Deleted

## 2017-01-05 DIAGNOSIS — O26893 Other specified pregnancy related conditions, third trimester: Secondary | ICD-10-CM | POA: Diagnosis present

## 2017-01-05 DIAGNOSIS — Z3493 Encounter for supervision of normal pregnancy, unspecified, third trimester: Secondary | ICD-10-CM | POA: Diagnosis present

## 2017-01-05 DIAGNOSIS — E039 Hypothyroidism, unspecified: Secondary | ICD-10-CM | POA: Diagnosis present

## 2017-01-05 DIAGNOSIS — O99284 Endocrine, nutritional and metabolic diseases complicating childbirth: Secondary | ICD-10-CM | POA: Diagnosis present

## 2017-01-05 DIAGNOSIS — J45909 Unspecified asthma, uncomplicated: Secondary | ICD-10-CM | POA: Diagnosis present

## 2017-01-05 DIAGNOSIS — O9952 Diseases of the respiratory system complicating childbirth: Secondary | ICD-10-CM | POA: Diagnosis present

## 2017-01-05 DIAGNOSIS — Z6791 Unspecified blood type, Rh negative: Secondary | ICD-10-CM

## 2017-01-05 DIAGNOSIS — O34219 Maternal care for unspecified type scar from previous cesarean delivery: Secondary | ICD-10-CM

## 2017-01-05 DIAGNOSIS — O99344 Other mental disorders complicating childbirth: Secondary | ICD-10-CM | POA: Diagnosis present

## 2017-01-05 DIAGNOSIS — F419 Anxiety disorder, unspecified: Secondary | ICD-10-CM | POA: Diagnosis present

## 2017-01-05 DIAGNOSIS — Z3A39 39 weeks gestation of pregnancy: Secondary | ICD-10-CM | POA: Diagnosis not present

## 2017-01-05 LAB — CBC
HEMATOCRIT: 31.9 % — AB (ref 36.0–46.0)
Hemoglobin: 10.2 g/dL — ABNORMAL LOW (ref 12.0–15.0)
MCH: 25.3 pg — ABNORMAL LOW (ref 26.0–34.0)
MCHC: 32 g/dL (ref 30.0–36.0)
MCV: 79.2 fL (ref 78.0–100.0)
Platelets: 228 10*3/uL (ref 150–400)
RBC: 4.03 MIL/uL (ref 3.87–5.11)
RDW: 14.7 % (ref 11.5–15.5)
WBC: 9 10*3/uL (ref 4.0–10.5)

## 2017-01-05 LAB — TYPE AND SCREEN
ABO/RH(D): O NEG
Antibody Screen: NEGATIVE

## 2017-01-05 MED ORDER — SOD CITRATE-CITRIC ACID 500-334 MG/5ML PO SOLN: 30.0000 mL | ORAL | Status: DC | PRN

## 2017-01-05 MED ORDER — FENTANYL CITRATE (PF) 100 MCG/2ML IJ SOLN: 50.0000 ug | INTRAMUSCULAR | Status: DC | PRN

## 2017-01-05 MED ORDER — LACTATED RINGERS IV SOLN
INTRAVENOUS | Status: DC
  Administered 2017-01-05: 08:00:00 via INTRAVENOUS

## 2017-01-05 MED ORDER — OXYTOCIN 40 UNITS IN LACTATED RINGERS INFUSION - SIMPLE MED
1.0000 m[IU]/min | INTRAVENOUS | Status: DC
  Administered 2017-01-05: 2 m[IU]/min via INTRAVENOUS
  Filled 2017-01-05: qty 1000

## 2017-01-05 MED ORDER — ACETAMINOPHEN 325 MG PO TABS
650.0000 mg | ORAL_TABLET | ORAL | Status: DC | PRN
  Administered 2017-01-05: 650 mg via ORAL
  Filled 2017-01-05: qty 2

## 2017-01-05 MED ORDER — SIMETHICONE 80 MG PO CHEW: 80.0000 mg | CHEWABLE_TABLET | ORAL | Status: DC | PRN

## 2017-01-05 MED ORDER — SENNOSIDES-DOCUSATE SODIUM 8.6-50 MG PO TABS
2.0000 | ORAL_TABLET | ORAL | Status: DC
  Administered 2017-01-05: 2 via ORAL
  Filled 2017-01-05: qty 2

## 2017-01-05 MED ORDER — OXYTOCIN 40 UNITS IN LACTATED RINGERS INFUSION - SIMPLE MED: 2.5000 [IU]/h | INTRAVENOUS | Status: DC

## 2017-01-05 MED ORDER — ONDANSETRON HCL 4 MG/2ML IJ SOLN: 4.0000 mg | Freq: Four times a day (QID) | INTRAMUSCULAR | Status: DC | PRN

## 2017-01-05 MED ORDER — OXYTOCIN BOLUS FROM INFUSION: 500.0000 mL | Freq: Once | INTRAVENOUS | Status: DC

## 2017-01-05 MED ORDER — HYDROXYZINE HCL 50 MG PO TABS
50.0000 mg | ORAL_TABLET | Freq: Four times a day (QID) | ORAL | Status: DC | PRN
  Filled 2017-01-05: qty 1

## 2017-01-05 MED ORDER — DIBUCAINE 1 % RE OINT: 1.0000 "application " | TOPICAL_OINTMENT | RECTAL | Status: DC | PRN

## 2017-01-05 MED ORDER — LAMOTRIGINE 150 MG PO TABS: 150.0000 mg | ORAL_TABLET | Freq: Two times a day (BID) | ORAL | Status: DC

## 2017-01-05 MED ORDER — PHENYLEPHRINE 40 MCG/ML (10ML) SYRINGE FOR IV PUSH (FOR BLOOD PRESSURE SUPPORT)
80.0000 ug | PREFILLED_SYRINGE | INTRAVENOUS | Status: DC | PRN
  Filled 2017-01-05: qty 10
  Filled 2017-01-05: qty 5

## 2017-01-05 MED ORDER — DIPHENHYDRAMINE HCL 50 MG/ML IJ SOLN: 12.5000 mg | INTRAMUSCULAR | Status: DC | PRN

## 2017-01-05 MED ORDER — ZOLPIDEM TARTRATE 5 MG PO TABS: 5.0000 mg | ORAL_TABLET | Freq: Every evening | ORAL | Status: DC | PRN

## 2017-01-05 MED ORDER — LAMOTRIGINE 200 MG PO TABS
400.0000 mg | ORAL_TABLET | Freq: Every day | ORAL | Status: DC
  Administered 2017-01-06: 400 mg via ORAL
  Filled 2017-01-05 (×3): qty 2

## 2017-01-05 MED ORDER — PRENATAL MULTIVITAMIN CH
1.0000 | ORAL_TABLET | Freq: Every day | ORAL | Status: DC
  Administered 2017-01-06: 1 via ORAL

## 2017-01-05 MED ORDER — OXYCODONE-ACETAMINOPHEN 5-325 MG PO TABS
1.0000 | ORAL_TABLET | ORAL | Status: DC | PRN
  Administered 2017-01-06 (×2): 1 via ORAL
  Filled 2017-01-05 (×2): qty 1

## 2017-01-05 MED ORDER — EPHEDRINE 5 MG/ML INJ
10.0000 mg | INTRAVENOUS | Status: DC | PRN
  Filled 2017-01-05: qty 4

## 2017-01-05 MED ORDER — FENTANYL 2.5 MCG/ML BUPIVACAINE 1/10 % EPIDURAL INFUSION (WH - ANES)
14.0000 mL/h | INTRAMUSCULAR | Status: DC | PRN
  Administered 2017-01-05: 14 mL/h via EPIDURAL
  Filled 2017-01-05: qty 100

## 2017-01-05 MED ORDER — LIDOCAINE HCL (PF) 1 % IJ SOLN
30.0000 mL | INTRAMUSCULAR | Status: DC | PRN
  Filled 2017-01-05: qty 30

## 2017-01-05 MED ORDER — LIDOCAINE HCL (PF) 1 % IJ SOLN
INTRAMUSCULAR | Status: DC | PRN
  Administered 2017-01-05: 3 mL via EPIDURAL
  Administered 2017-01-05: 5 mL via EPIDURAL
  Administered 2017-01-05: 2 mL via EPIDURAL

## 2017-01-05 MED ORDER — COCONUT OIL OIL: 1.0000 "application " | TOPICAL_OIL | Status: DC | PRN

## 2017-01-05 MED ORDER — BENZOCAINE-MENTHOL 20-0.5 % EX AERO
1.0000 "application " | INHALATION_SPRAY | CUTANEOUS | Status: DC | PRN
  Administered 2017-01-05: 1 via TOPICAL
  Filled 2017-01-05: qty 56

## 2017-01-05 MED ORDER — WITCH HAZEL-GLYCERIN EX PADS: 1.0000 "application " | MEDICATED_PAD | CUTANEOUS | Status: DC | PRN

## 2017-01-05 MED ORDER — IBUPROFEN 600 MG PO TABS
600.0000 mg | ORAL_TABLET | Freq: Four times a day (QID) | ORAL | Status: DC
  Administered 2017-01-05 – 2017-01-06 (×5): 600 mg via ORAL
  Filled 2017-01-05 (×4): qty 1

## 2017-01-05 MED ORDER — LACTATED RINGERS IV SOLN: 500.0000 mL | INTRAVENOUS | Status: DC | PRN

## 2017-01-05 MED ORDER — MISOPROSTOL 25 MCG QUARTER TABLET
25.0000 ug | ORAL_TABLET | ORAL | Status: DC | PRN
  Administered 2017-01-05: 25 ug via VAGINAL
  Filled 2017-01-05: qty 0.25
  Filled 2017-01-05: qty 1

## 2017-01-05 MED ORDER — ONDANSETRON HCL 4 MG PO TABS: 4.0000 mg | ORAL_TABLET | ORAL | Status: DC | PRN

## 2017-01-05 MED ORDER — TERBUTALINE SULFATE 1 MG/ML IJ SOLN
0.2500 mg | Freq: Once | INTRAMUSCULAR | Status: DC | PRN
  Filled 2017-01-05: qty 1

## 2017-01-05 MED ORDER — ACETAMINOPHEN 325 MG PO TABS: 650.0000 mg | ORAL_TABLET | ORAL | Status: DC | PRN

## 2017-01-05 MED ORDER — ONDANSETRON HCL 4 MG/2ML IJ SOLN: 4.0000 mg | INTRAMUSCULAR | Status: DC | PRN

## 2017-01-05 MED ORDER — LACTATED RINGERS IV SOLN: 500.0000 mL | Freq: Once | INTRAVENOUS | Status: DC

## 2017-01-05 MED ORDER — OXYCODONE-ACETAMINOPHEN 5-325 MG PO TABS
2.0000 | ORAL_TABLET | ORAL | Status: DC | PRN
  Administered 2017-01-06: 2 via ORAL
  Filled 2017-01-05: qty 2

## 2017-01-05 MED ORDER — DIPHENHYDRAMINE HCL 25 MG PO CAPS: 25.0000 mg | ORAL_CAPSULE | Freq: Four times a day (QID) | ORAL | Status: DC | PRN

## 2017-01-05 MED ORDER — PHENYLEPHRINE 40 MCG/ML (10ML) SYRINGE FOR IV PUSH (FOR BLOOD PRESSURE SUPPORT)
80.0000 ug | PREFILLED_SYRINGE | INTRAVENOUS | Status: DC | PRN
  Filled 2017-01-05: qty 5

## 2017-01-05 MED ORDER — LEVOTHYROXINE SODIUM 75 MCG PO TABS
75.0000 ug | ORAL_TABLET | Freq: Every day | ORAL | Status: DC
  Administered 2017-01-06: 75 ug via ORAL
  Filled 2017-01-05 (×2): qty 1

## 2017-01-05 NOTE — H&P (Addendum)
HPI: 31 y/o G2P1001 @ 6364w4d estimated gestational age (as dated by LMP c/w 20 week ultrasound) presents for elective IOL.  Pt has been unable to take her medication due to nausea/vomiting, she does not feel well and wishes to proceed with induciton.   no Leaking of Fluid,   no Vaginal Bleeding,   no Uterine Contractions,  + Fetal Movement.  ROS: no HA, no epigastric pain, no visual changes.    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- last US on 2/1: vertex/EFW: 6#10oz (32%) 6) Asthma- Singulair daily (off medication recently), denies SOB or any current issues   Prenatal Transfer Tool  Maternal Diabetes: No Genetic Screening: Normal Maternal Ultrasounds/Referrals: Normal Fetal Ultrasounds or other Referrals:  None Maternal Substance Abuse:  No Significant Maternal Medications:  Meds include: Other: Lamictal, Synthroid Significant Maternal Lab Results: Lab values include: Group B Strep negative   PNL:  GBS negative, Rub Immune, Hep B neg, RPR NR, HIV neg, GC/C neg, glucola:normal Hgb: 10.4 (11/27) Blood type: O negative Panorama- low risk female  Immunizations: Tdap: 12/27 Flu: 10/5  OBHx: FTNSVD x 1, 7#5oz PMHx:  Hypothyroidism, asthma, h/o TIA, anxiety Meds:  PNV, Lamictal, Synthroid, singulair Allergy:  No Known Allergies SurgHx: none SocHx:   no Tobacco, no  EtOH, no Illicit Drugs  O: LMP 04/03/2016  (seen in office) Gen. AAOx3, NAD Resp. Normal respiratory rate and effort Abd. Gravid,  no tenderness,  no rigidity,  no guarding Extr.  1+ edema, no calf tenderness bilaterally  FHT: 130 baseline, moderate variability, + accels,  no decels Toco: +irritability SVE: 1/long/high   Labs: see orders  A/P:  31 y.o. G2P1001 @  3257w3d EGA who presents for elective IOL -FWB:  NICHD Cat I FHTs -Labor: plan for cytotec -GBS: negative -Pain management: IV or epidural upon request -Hypothyroidism: continue synthroid -Anxiety; continue Lamictal -Asthma: will order inhaler if needed   Myna HidalgoJennifer Branden Shallenberger, DO [phone] (pager) [phone] (office)

## 2017-01-05 NOTE — Anesthesia Procedure Notes (Signed)
Epidural Patient location during procedure: OB Start time: 01/05/2017 11:23 AM End time: 01/05/2017 11:28 AM  Staffing Anesthesiologist: Cecile HearingURK, STEPHEN EDWARD Performed: anesthesiologist   Preanesthetic Checklist Completed: patient identified, pre-op evaluation, timeout performed, IV checked, risks and benefits discussed and monitors and equipment checked  Epidural Patient position: sitting Prep: DuraPrep Patient monitoring: blood pressure and continuous pulse ox Approach: midline Location: L3-L4 Injection technique: LOR air  Needle:  Needle type: Tuohy  Needle gauge: 17 G Needle length: 9 cm Needle insertion depth: 4 cm Catheter size: 19 Gauge Catheter at skin depth: 9 cm Test dose: negative and Other (1% Lidocaine)  Additional Notes Patient identified.  Risk benefits discussed including failed block, incomplete pain control, headache, nerve damage, paralysis, blood pressure changes, nausea, vomiting, reactions to medication both toxic or allergic, and postpartum back pain.  Patient expressed understanding and wished to proceed.  All questions were answered.  Sterile technique used throughout procedure and epidural site dressed with sterile barrier dressing. No paresthesia or other complications noted. The patient did not experience any signs of intravascular injection such as tinnitus or metallic taste in mouth nor signs of intrathecal spread such as rapid motor block. Please see nursing notes for vital signs. Reason for block:procedure for pain

## 2017-01-05 NOTE — Anesthesia Preprocedure Evaluation (Signed)
Anesthesia Evaluation  Patient identified by MRN, date of birth, ID band Patient awake    Reviewed: Allergy & Precautions, NPO status , Patient's Chart, lab work & pertinent test results  Airway Mallampati: II  TM Distance: >3 FB Neck ROM: Full    Dental  (+) Teeth Intact, Dental Advisory Given   Pulmonary asthma ,    Pulmonary exam normal breath sounds clear to auscultation       Cardiovascular Exercise Tolerance: Good Normal cardiovascular exam Rhythm:Regular Rate:Normal  S/p PFO Closure    Neuro/Psych  Headaches, PSYCHIATRIC DISORDERS Anxiety Depression TIA (due to PFO)   GI/Hepatic negative GI ROS, Neg liver ROS,   Endo/Other  negative endocrine ROS  Renal/GU negative Renal ROS     Musculoskeletal negative musculoskeletal ROS (+)   Abdominal   Peds  Hematology  (+) Blood dyscrasia, anemia , Plt 228k   Anesthesia Other Findings Day of surgery medications reviewed with the patient.  Reproductive/Obstetrics (+) Pregnancy                             Anesthesia Physical Anesthesia Plan  ASA: II  Anesthesia Plan: Epidural   Post-op Pain Management:    Induction:   Airway Management Planned:   Additional Equipment:   Intra-op Plan:   Post-operative Plan:   Informed Consent: I have reviewed the patients History and Physical, chart, labs and discussed the procedure including the risks, benefits and alternatives for the proposed anesthesia with the patient or authorized representative who has indicated his/her understanding and acceptance.   Dental advisory given  Plan Discussed with:   Anesthesia Plan Comments: (Patient identified. Risks/Benefits/Options discussed with patient including but not limited to bleeding, infection, nerve damage, paralysis, failed block, incomplete pain control, headache, blood pressure changes, nausea, vomiting, reactions to medication both or  allergic, itching and postpartum back pain. Confirmed with bedside nurse the patient's most recent platelet count. Confirmed with patient that they are not currently taking any anticoagulation, have any bleeding history or any family history of bleeding disorders. Patient expressed understanding and wished to proceed. All questions were answered. )        Anesthesia Quick Evaluation

## 2017-01-05 NOTE — Progress Notes (Signed)
OB PN:  S: Pt resting comfortably with epidural, no acute complaints  O: BP 122/82   Pulse 89   LMP 04/03/2016   FHT: 130bpm, moderate variablity, + accels, no decels Toco: irregular SVE: 3-4/50/-2, AROM blood-tinged fluid  A/P: 31 y.o. G2P1001 @ 1520w4d who presented for eIOL 1. FWB: Cat. I 2. Labor: plan for Pit per protocol if contractions space out Pain: continue with epidural GBS: neg Anxiety/Depression: continue Lamictal Hypothyroidism: continue Levothyroxine  Myna HidalgoJennifer Jhanvi Drakeford, DO [phone] (pager) [phone] (office)

## 2017-01-05 NOTE — Lactation Note (Signed)
This note was copied from a baby's chart. Lactation Consultation Note  Patient Name: Girl Jennette DubinRebecca Halliday EAVWU'JToday's Date: 01/05/2017 Reason for consult: Initial assessment [facility] and was told to come back later because they were not feeding "right now".  [facility]. Parents asked that lactation come back later because they are feeling overwhelmed. Mom will call at the next bf. Left lactation handouts.   Maternal Data    Feeding Feeding Type: Breast Fed  LATCH Score/Interventions Latch: Grasps breast easily, tongue down, lips flanged, rhythmical sucking. Intervention(s): Adjust position;Assist with latch  Audible Swallowing: A few with stimulation Intervention(s): Skin to skin  Type of Nipple: Everted at rest and after stimulation  Comfort (Breast/Nipple): Soft / non-tender     Hold (Positioning): Assistance needed to correctly position infant at breast and maintain latch. Intervention(s): Support Pillows  LATCH Score: 8  Lactation Tools Discussed/Used     Consult Status Consult Status: Follow-up Date: 01/06/17 Follow-up type: In-patient    Rulon Eisenmengerlizabeth E Murel Wigle 01/05/2017, 6:31 PM

## 2017-01-05 NOTE — Anesthesia Pain Management Evaluation Note (Signed)
  [facility]NA Pain Management Visit Note  Patient: Gloria NaasRebecca C Cruz, 31 y.o., female  "[facility] We have an anesthesia team available at all times to provide care throughout the hospital, including epidural management and anesthesia for C-section. I don't know your plan for the delivery whether it a natural birth, water birth, IV sedation, nitrous supplementation, doula or epidural, but we want to meet your pain goals."   1.Was your pain managed to your expectations on prior hospitalizations?   Yes   2.What is your expectation for pain management during this hospitalization?     Epidural  3.How can we help you reach that goal? unsure  Record the patient's initial score and the patient's pain goal.   Pain: 4  Pain Goal: 6 [facility] you to be able to say your pain was always managed very well.  Cephus ShellingBURGER,Melia Hopes 01/05/2017

## 2017-01-05 NOTE — Progress Notes (Signed)
OB PN:  S: Pt starting to feel pressure  O: BP (!) 115/58   Pulse 94   Temp 97.8 F (36.6 C) (Axillary)   LMP 04/03/2016   FHT: 150bpm, moderate variablity, + accels, occasional variable decel Toco: q1-414min SVE: C/C/+1  A/P: 31 y.o. G2P1001 @ 4273w4d who presented for elective IOL 1. FWB: Cat. II- pt repositioned, overall FHT reassuring 2. Labor: continue Pit per protocol, trial of pushing started Pain: continue with epidural GBS: neg Anxiety/Depression: continue Lamictal Hypothyroidism: continue Levothyroxine  Myna HidalgoJennifer Corretta Munce, DO [phone] (pager) [phone] (office)

## 2017-01-06 ENCOUNTER — Encounter (HOSPITAL_COMMUNITY): Payer: Self-pay | Admitting: Obstetrics and Gynecology

## 2017-01-06 LAB — CBC
HEMATOCRIT: 26.7 % — AB (ref 36.0–46.0)
HEMOGLOBIN: 8.8 g/dL — AB (ref 12.0–15.0)
MCH: 26.2 pg (ref 26.0–34.0)
MCHC: 33 g/dL (ref 30.0–36.0)
MCV: 79.5 fL (ref 78.0–100.0)
Platelets: 190 10*3/uL (ref 150–400)
RBC: 3.36 MIL/uL — ABNORMAL LOW (ref 3.87–5.11)
RDW: 14.8 % (ref 11.5–15.5)
WBC: 12.7 10*3/uL — AB (ref 4.0–10.5)

## 2017-01-06 LAB — RPR: RPR: NONREACTIVE

## 2017-01-06 MED ORDER — IBUPROFEN 600 MG PO TABS: 600.0000 mg | ORAL_TABLET | Freq: Four times a day (QID) | ORAL | 0 refills | Status: DC

## 2017-01-06 MED ORDER — FERROUS GLUCONATE 324 (38 FE) MG PO TABS: 324.0000 mg | ORAL_TABLET | Freq: Every day | ORAL | 0 refills | Status: DC

## 2017-01-06 MED ORDER — RHO D IMMUNE GLOBULIN 1500 UNIT/2ML IJ SOSY
300.0000 ug | PREFILLED_SYRINGE | Freq: Once | INTRAMUSCULAR | Status: AC
  Administered 2017-01-06: 300 ug via INTRAVENOUS
  Filled 2017-01-06: qty 2

## 2017-01-06 MED ORDER — FERROUS GLUCONATE 324 (38 FE) MG PO TABS
324.0000 mg | ORAL_TABLET | Freq: Every day | ORAL | Status: DC
  Filled 2017-01-06 (×2): qty 1

## 2017-01-06 MED ORDER — OXYCODONE-ACETAMINOPHEN 5-325 MG PO TABS: 1.0000 | ORAL_TABLET | ORAL | 0 refills | Status: DC | PRN

## 2017-01-06 NOTE — Discharge Instructions (Signed)

## 2017-01-06 NOTE — Progress Notes (Signed)
MOB was referred for history of depression/anxiety.  Referral is screened out by Clinical Social Worker because none of the following criteria appear to apply and there are no reports impacting the pregnancy or her transition to the postpartum period.  CSW does not deem it clinically necessary to further investigate at this time.   -History of anxiety/depression during this pregnancy, or of post-partum depression.  - Diagnosis of anxiety and/or depression within last 3 years.-  - History of depression due to pregnancy loss/loss of child or -MOB's symptoms are currently being treated with medication and/or therapy.  Please contact the Clinical Social Worker if needs arise or upon MOB request.    Jazarah Capili, MSW, LCSW-A Clinical Social Worker  [facility]  Office: [phone]   

## 2017-01-06 NOTE — Anesthesia Postprocedure Evaluation (Signed)
Anesthesia Post Note  Patient: Gloria NaasRebecca C Papaleo  Procedure(s) Performed: * No procedures listed *  Patient location during evaluation: Mother Baby Anesthesia Type: Epidural Level of consciousness: awake and alert Pain management: pain level controlled Vital Signs Assessment: post-procedure vital signs reviewed and stable Respiratory status: spontaneous breathing, nonlabored ventilation and respiratory function stable Cardiovascular status: stable Postop Assessment: no headache, no backache and epidural receding Anesthetic complications: no        Last Vitals:  Vitals:   01/05/17 2141 01/06/17 0530  BP: 117/64 123/68  Pulse: 77 66  Resp: 18 18  Temp: 36.8 C 36.6 C    Last Pain:  Vitals:   01/06/17 0831  TempSrc:   PainSc: 2    Pain Goal: Patients Stated Pain Goal: 3 (01/06/17 0831)               Junious SilkGILBERT,Azeez Dunker

## 2017-01-06 NOTE — Lactation Note (Addendum)
This note was copied from a baby's chart. Lactation Consultation Note  Patient Name: [name] WUJWJ'XToday's Date: 01/06/2017 Reason for consult: Follow-up assessment - possible D/C later today after 24 [facility] .  Baby is 6918 hours old and has been to the breast 6 times prior to this am LC visit - Breast  Feeding range - 5-25 mins.  @ consult LC assisted to review positions ( football, cross cradle). Baby seemed to be fussy initially at both positions and it would take a few minutes to latch and depth obtained initially and the baby would slide to shallow latch and still stay on. Baby noted to have a small mouth, high palate and due to the base of the nipple / areola complex having some edema, probably cause of the baby not keeping the depth at the breast. LC also fit mom with #24 NS, challenge noted,  Baby's mouth is small and doesn't obtain the depth. So until the baby opens wider the NS isn't effective.  LC recommended prior to every latch breast massage, hand express, pre pump with hand pump to make the nipple / areola complex more elastic ( LC tried it on the right breast ) , several  large drops of colostrum noted after that, and tissue more elastic. Offer 2nd breast.  Also instructed mom on the use of shells when baby isn't STS to work on making the areola more compressible for deeper latch.  Per mom will have a DEBP at home.  After Pedis MD examined the baby . Mom latched the baby in cross cradle, few swallows noted,  LC eased chin down to obtain better depth.   LC obtained a LC O/P referral from Dr. Myna HidalgoJennifer Ozan today , [facility] 01[facility]06/2017 9 am , Appt. Reminder given to mom    Maternal Data Has patient been taught Hand Expression?: Yes  Feeding Feeding Type: Breast Fed Length of feed: 5 min (few swallows )  LATCH Score/Interventions Latch: Repeated attempts needed to sustain latch, nipple held in mouth throughout feeding, stimulation needed to  elicit sucking reflex. Intervention(s): Adjust position;Assist with latch;Breast massage;Breast compression  Audible Swallowing: A few with stimulation Intervention(s): Skin to skin;Hand expression;Alternate breast massage  Type of Nipple: Everted at rest and after stimulation  Comfort (Breast/Nipple): Soft / non-tender     Hold (Positioning): Assistance needed to correctly position infant at breast and maintain latch. Intervention(s): Breastfeeding basics reviewed;Support Pillows;Position options;Skin to skin  LATCH Score: 7  Lactation Tools Discussed/Used Tools: Pump;Shells Shell Type: Inverted Breast pump type: Manual   Consult Status Consult Status: Follow-up Date: 01/06/17 Follow-up type: Out-patient    Matilde SprangMargaret Ann Lincoln Kleiner 01/06/2017, 10:36 AM

## 2017-01-06 NOTE — Lactation Note (Signed)
This note was copied from a baby's chart. Lactation Consultation Note  Patient Name: Gloria Jennette DubinRebecca Dillard ZOXWR'UToday's Date: 01/06/2017 Reason for consult: Follow-up assessment Infant is 1423 hours old & seen by lactation for follow-up assessment. RN had just gotten baby latched in football hold on left breast when LC entered but then baby fell asleep. Tried to wake baby; when baby woke up, she cried and would not latch or suckle. Encouraged mom to do hand expression but did not see any colostrum. Mom used hand pump and received ~112mL, which we fed back to baby via gloved finger and then a spoon. After baby received this milk, she fell asleep again. Baby then woke up & was acting like she was looking for the breast so mom latched baby on left breast in cross-cradle. Baby suckled a little but then came off and started crying again. Encouraged mom to put baby skin-to-skin to calm her down. Mom asked about what they should do if she will not latch once they go home. Encouraged mom to continue to work on latching baby. Discussed how she could pump after BF with either her hand pump or her DEBP at home and they could do spoon feeding with her colostrum or they could use the curved tip syringe that was in her room - showed mom how to do so. Provided mom with feeding amount guidelines. Mom encouraged to feed baby 8-12 times/24 hours and with feeding cues.  Mom has Outpatient Lactation appointment for 01/09/17. Mom reports no questions at this time. Encouraged mom to ask for her RN if she has any further questions or needs assistance with latch.  Maternal Data    Feeding Feeding Type: Breast Fed Length of feed: 5 min  LATCH Score/Interventions                      Lactation Tools Discussed/Used     Consult Status Consult Status: Follow-up Date: 01/07/17 Follow-up type: In-patient    Oneal GroutLaura C Raylan Troiani 01/06/2017, 5:19 PM

## 2017-01-06 NOTE — Progress Notes (Signed)
Postpartum Note Day # 1  S:  Patient resting comfortable in bed.  Pain controlled.  Tolerating general diet. + flatus, no BM.  Lochia moderate to minimal.  Ambulating without difficulty.  She denies n/v/f/c, SOB, or CP.  Pt plans on breastfeeding.  O: Temp:  [97.8 F (36.6 C)-98.4 F (36.9 C)] 97.9 F (36.6 C) (02/03 0530) Pulse Rate:  [66-116] 66 (02/03 0530) Resp:  [18] 18 (02/03 0530) BP: (82-138)/(46-90) 123/68 (02/03 0530) Gen: A&Ox3, NAD CV: RRR, no MRG Resp: CTAB Abdomen: soft, NT, ND Uterus: firm, non-tender, below umbilicus Ext: No edema, no calf tenderness bilaterally  Labs:  Recent Labs  01/05/17 0815 01/06/17 0518  HGB 10.2* 8.8*    A/P: Pt is a 31 y.o. [id number] s/p NSVD, PPD#1  - Pain well controlled -GU: UOP is adequate -GI: Tolerating general diet -Activity: encouraged sitting up to chair and ambulation as tolerated -Prophylaxis: early ambulation -Labs: Appropriate decline for recent delivery, pt asymptomatic, plan for iron daily  Meeting postpartum milestones appropriately, will consider early discharge home later today pending breastfeeding and evaluation of baby by PEDS.  CCOB covering  Myna HidalgoJennifer Williette Loewe, [place] [phone] (pager) [phone] (office)

## 2017-01-07 LAB — RH IG WORKUP (INCLUDES ABO/RH)
ABO/RH(D): O NEG
FETAL SCREEN: NEGATIVE
Gestational Age(Wks): 39.4
UNIT DIVISION: 0

## 2017-01-07 NOTE — Discharge Summary (Signed)
OB Discharge Summary     Patient Name: Reginia NaasRebecca C Levan DOB: [birth year] MRN: [id number]  Date of admission: 01/05/2017 Delivering MD: Myna HidalgoZAN, JENNIFER   Date of discharge: 01/06/2017  Admitting diagnosis: INDUCTION Intrauterine pregnancy: 5981w4d     Secondary diagnosis:  Principal Problem:   VBAC (vaginal birth after Cesarean) Active Problems:   Normal intrauterine pregnancy in third trimester  Additional problems: None     Discharge diagnosis: Term Pregnancy Delivered                                                                                                Post partum procedures:rhogam  Augmentation: AROM and Cytotec  Complications: None  Hospital course:  Induction of Labor With Vaginal Delivery   31 y.o. yo [id number] at 1881w4d was admitted to the hospital 01/05/2017 for induction of labor.  Indication for induction: elective.  Patient had an uncomplicated labor course as follows: Membrane Rupture Time/Date: 12:28 PM ,01/05/2017   Intrapartum Procedures: Episiotomy: None [1]                                         Lacerations:  2nd degree [3]  Patient had delivery of a Viable infant.  Information for the patient's newborn:  Janace LittenJudski, Girl Lurena JoinerRebecca [[id number]][[id number]]  Delivery Method: VBAC, Spontaneous (Filed from Delivery Summary)   01/05/2017  Details of delivery can be found in separate delivery note.  Patient had a routine postpartum course. Patient is discharged home 01/07/17.  Physical exam  Vitals:   01/05/17 1911 01/05/17 2141 01/06/17 0530 01/06/17 1259  BP: 132/62 117/64 123/68 (!) 115/56  Pulse: 97 77 66 79  Resp: 18 18 18 18   Temp: 98.4 F (36.9 C) 98.2 F (36.8 C) 97.9 F (36.6 C) 98 F (36.7 C)  TempSrc: Oral Oral Oral Oral   General: alert, cooperative and no distress Lochia: appropriate Uterine Fundus: firm Incision: N/A, Perineum well approximated DVT Evaluation: No evidence of DVT seen on physical exam. Negative Homan's sign. Labs: Lab Results  Component  Value Date   WBC 12.7 (H) 01/06/2017   HGB 8.8 (L) 01/06/2017   HCT 26.7 (L) 01/06/2017   MCV 79.5 01/06/2017   PLT 190 01/06/2017   CMP Latest Ref Rng & Units 10/18/2015  Glucose 65 - 99 mg/dL 95  BUN 6 - 20 mg/dL 9  Creatinine 1.300.44 - 8.651.00 mg/dL 7.840.73  Sodium 696135 - 295145 mmol/L 138  Potassium 3.5 - 5.1 mmol/L 3.3(L)  Chloride 101 - 111 mmol/L 105  CO2 22 - 32 mmol/L 25  Calcium 8.9 - 10.3 mg/dL 9.0  Total Protein 6.0 - 8.3 g/dL -  Total Bilirubin 0.3 - 1.2 mg/dL -  Alkaline Phos 39 - 284117 U/L -  AST 0 - 37 U/L -  ALT 0 - 35 U/L -    Discharge instruction: per After Visit Summary and "Baby and Me Booklet".  After visit meds:  Allergies as of 01/06/2017   No Known Allergies     Medication  List    TAKE these medications   albuterol 108 (90 Base) MCG/ACT inhaler Commonly known as:  PROVENTIL HFA;VENTOLIN HFA Inhale 2 puffs into the lungs every 6 (six) hours as needed for wheezing.   ferrous gluconate 324 MG tablet Commonly known as:  FERGON Take 1 tablet (324 mg total) by mouth daily with breakfast.   ibuprofen 600 MG tablet Commonly known as:  ADVIL,MOTRIN Take 1 tablet (600 mg total) by mouth every 6 (six) hours.   lamoTRIgine 200 MG tablet Commonly known as:  LAMICTAL Take 400 mg by mouth daily.   levothyroxine 75 MCG tablet Commonly known as:  SYNTHROID, LEVOTHROID Take 75 mcg by mouth daily before breakfast.   montelukast 10 MG tablet Commonly known as:  SINGULAIR Take 10 mg by mouth at bedtime.   oxyCODONE-acetaminophen 5-325 MG tablet Commonly known as:  PERCOCET/ROXICET Take 1 tablet by mouth every 4 (four) hours as needed (for pain scale greater than or equal to 4 and less than 7).       Diet: routine diet  Activity: Advance as tolerated. Pelvic rest for 6 weeks.   Outpatient follow up:2 weeks Follow up Appt:Future Appointments Date Time Provider Department Center  01/09/2017 9:00 AM [facility] LAC CONSULTANT [facility] None   Follow up Visit:No Follow-up  on file.  Postpartum contraception: Not Discussed  Newborn Data: Live born female  Birth Weight: 6 lb 14.6 oz (3135 g) APGAR: 9, 9  Baby Feeding: Breast Disposition:home with mother   01/07/2017 Kenney Houseman, CNM

## 2017-01-09 ENCOUNTER — Ambulatory Visit (HOSPITAL_COMMUNITY)
Admit: 2017-01-09 | Discharge: 2017-01-09 | Disposition: A | Discharge status: Home / Self Care | Payer: Managed Care, Other (non HMO) | Attending: Obstetrics & Gynecology | Admitting: Obstetrics & Gynecology

## 2017-01-09 NOTE — Lactation Note (Signed)
Lactation Consult  Mother's reason for visit:  Help with latch, History of LMS Visit Type:  Outpatient Appointment Notes:  Mom reports baby is not latching well, will nurse for about 5 minutes, not sustaining latch. Mom is supplementing. Mom was not successful BF her 1st child, history of LMS.  Consult:  Initial Lactation Consultant:  Alfred LevinsGranger, Wilmore Holsomback Ann  ________________________________________________________________________  Baby's Name:  Gorden Harmsharleston Rose Musselman Date of Birth:  01/05/2018 [facility] Gender:  female Gestational Age: 864w4d (At Birth) Birth Weight:  6 lb 14.6 oz (3135 g) Weight at Discharge:  Weight: 6 lb 11.1 oz (3035 g)             Date of Discharge:  01/06/2017     [facility]   01/05/17 1603 01/06/17 0050  Weight: 6 lb 14.6 oz (3135 g) 6 lb 11.1 oz (3035 g)  [facility]  01/08/17 6 lb. 4.8 oz     Location:Pediatrician's office Weight today:  6 lb. 5.7 oz/2884 gm with clean diaper, baby 314 days old.    ________________________________________________________________________  Mother's Name: [name] Type of delivery:  SVB Breastfeeding Experience:  P2 - Not successful with BF, LMS Maternal Medical Conditions:  Stroke, TIA, Asthma, Anxiety/Depression, kidney stone, Patent Foramen Ovale - closure 2009 Maternal Medications:  Motrin prn, Tylenol Cold/flu past few days, Iron supplement, Fenugreek 2 caps TID Mom asking about anti-anxiety med, considering Klonopin- per [place] SliderHale Ativan would be better choice. Decreased half/line. Mom to discuss with MD. Both L3. Discussed infant risk and per [place] SliderHale advised not to start till after baby is over 471 week old.   ________________________________________________________________________  Breastfeeding History (Post Discharge)  Frequency of breastfeeding:  2-3 attempts.  Duration of feeding:  5 minutes  Mom has Medela PNS DEBP and is pumping every 4-5 hours, now  receiving 1-2 oz with pumping. Baby is supplemented with EBM/formula 2 oz 6-7 times/yesterday. Parents using Enfamil Newborn. Using Dr. Manson PasseyBrown #1 bottle to supplement.   Infant Intake and Output Assessment  Voids:  4 in 24 hrs.  Color:  Clear yellow Stools:  3-4 in 24 hrs.  Color:  Black  ________________________________________________________________________  Maternal Breast Assessment  Breast:  Soft Nipple:  Erect Pain level:  0   _______________________________________________________________________ Feeding Assessment/Evaluation  Initial feeding assessment:  Infant's oral assessment:  WNL  Positioning:  Cross cradle changed to football after applying nipple shield.  Right breast  Mom attempting to latch baby in cross cradle hold but baby not obtaining/sustaining good depth. Getting to base of nipple, falling asleep.  After several attempts initiated #20 nipple shield and pre-loaded nipple shield with formula, after few more attempts baby was able to latch and sustain the latch. Baby would suckle intermittent, no swallows noted. Baby appeared to be confused at breast/sleepy.   Tools:  Nipple shield 20 mm Instructed on use and cleaning of tool:  Yes.    Pre-feed weight:  2884 g  (6 lb. 5.7 oz.) Post-feed weight:  2884 g (6 lb. 5.7 oz.) Amount transferred:  0 ml nursing off/on for 17 minutes. Lots of stimulation needed to keep baby interested.  On left breast, Mom applied 20 nipple shield and baby latched without as much difficulty but again would take few suckles then stop. Initiated 5 fr feeding tube/syringe at breast using formula. Using nipple shield and supplement baby developed a good suckling pattern, swallows noted. Baby took 32 ml with nursing for 15 minutes.  Pre-feed weight 2884 g (6 lb. 5.7 oz) Post-feed weight:  2916 g ( 6 lb. 6.8 oz) Amount transferred:  32 ml. 30 ml of Enfamil formula and 2 ml of breast milk  Mom finished giving 28 ml of Enfamil via Dr. Manson Passey  bottle #1. LC demonstrated paced feeding using bottle.   Total amount transferred:  32 ml  At breast using 5 fr feeding tube/syringe as SNS Total supplement given:  28 ml via Dr. Manson Passey bottle  Asked Mom what her goals were and she would like baby to latch. Observing feedings today, LC feels baby confused going from bottle to breast.  Discussed the following feeding plan with Mom to work on more time at breast while continuing to supplement till she has f/u and baby is transferring milk well at breast and her milk has come to full volume. Encouraged to BF with each feeding when possible - 8-12 times in 24 hours. Try to keep baby nursing 15-20 minutes both breasts when possible. Use 20 nipple shield to latch - pre-load as needed. Look for breast milk in nipple shield with feedings. Try to use 5 fr feeding tube/sryinge at breast to supplement with some feedings to keep baby interested in latching. Supplement every 3 hours with 45-60 ml of breast milk or formula. If baby fussy and will not latch, then pump/bottle feed that feeding and try breast again next feeding. If baby fussy with latching, then use SNS while nursing on 1 breast for up to 30 minutes, finish supplement with bottle if needed, then pump. Next feeding alternate which breast Mom BF and using SNS system.   This may shorten time it takes with each feeding making it easier to follow plan but will keep stimulation at breast each feeding. Pump both breasts every 3 hours for 15-20 minutes, at night pump every 4 hours.  Discussed changing from taking Fenugreek alone to either More Milk Plus by Mother[employer] or Lactation Support by Woody Seller. Recipe for Lactation cookies given to Mom. Eat oatmeal prn.  Lots of STS, hand expression. Mom able to demonstrate applying nipple shield. Reviewed cleaning of nipple shield, hand out given, sanitize QD. Reviewed cleaning of 5 fr feeding tube/syringe system with both parents.  F/u with lactation scheduled for  Tuesday, 01/16/17 at 0900. Mom to call for questions/concerns.

## 2017-01-16 ENCOUNTER — Ambulatory Visit (HOSPITAL_COMMUNITY): Admission: RE | Admit: 2017-01-16 | Payer: Managed Care, Other (non HMO) | Source: Ambulatory Visit

## 2017-05-16 ENCOUNTER — Emergency Department (HOSPITAL_COMMUNITY)
Admission: EM | Admit: 2017-05-16 | Discharge: 2017-05-16 | Disposition: A | Discharge status: Home / Self Care | Payer: Managed Care, Other (non HMO) | Attending: Emergency Medicine | Admitting: Emergency Medicine

## 2017-05-16 ENCOUNTER — Encounter (HOSPITAL_COMMUNITY): Payer: Self-pay | Admitting: Emergency Medicine

## 2017-05-16 DIAGNOSIS — J45909 Unspecified asthma, uncomplicated: Secondary | ICD-10-CM | POA: Insufficient documentation

## 2017-05-16 DIAGNOSIS — R55 Syncope and collapse: Secondary | ICD-10-CM | POA: Diagnosis present

## 2017-05-16 DIAGNOSIS — Z8673 Personal history of transient ischemic attack (TIA), and cerebral infarction without residual deficits: Secondary | ICD-10-CM | POA: Insufficient documentation

## 2017-05-16 DIAGNOSIS — Z79899 Other long term (current) drug therapy: Secondary | ICD-10-CM | POA: Diagnosis not present

## 2017-05-16 DIAGNOSIS — I10 Essential (primary) hypertension: Secondary | ICD-10-CM | POA: Insufficient documentation

## 2017-05-16 NOTE — ED Notes (Signed)
Patient requesting to leave. MD aware.

## 2017-05-16 NOTE — ED Provider Notes (Signed)
[facility]-EMER[facility]ENCY DEPT Provider Note   CSN: [id number] Arrival date & time: 05/16/17  0636     History   Chief Complaint Chief Complaint  Patient presents with  . Near Syncope    HPI [name] is a 31 y.o. female.  The history is provided by the patient.  Near Syncope  This is a new problem. The current episode started 1 to 2 hours ago. The problem occurs constantly. The problem has been gradually improving. Pertinent negatives include no chest pain, no abdominal pain, no headaches and no shortness of breath. Exacerbated by: activity. The symptoms are relieved by rest. She has tried nothing for the symptoms.   Reports mistakenly taking a double dose of her lamictal this am. States that she took her regular dose just as she awoke, but forgot about it. Then took another dose just before going to work out. Right after taking the second dose, she realized that she had taken an extra dose.   States she feels "drugged up" and drowsy.   Denies SI, feels safe at home.   Past Medical History:  Diagnosis Date  . ANXIETY   . Asthma last used inhaler 08/03/13   Seasonal asthma  . DEPRESSION   . Kidney stone may 2012  . PATENT FORAMEN OVALE   . Stroke (HCC) 10/2007  . TRANSIENT ISCHEMIC ATTACK     Patient Active Problem List   Diagnosis Date Noted  . Normal intrauterine pregnancy in third trimester 01/05/2017  . VBAC (vaginal birth after Cesarean) 01/05/2017  . Chronic fatigue 05/14/2014  . DOE (dyspnea on exertion) 05/14/2014  . Encounter for preconception consultation 11/05/2012  . TIA (transient ischemic attack) 11/05/2012  . Patent foramen ovale 11/05/2012  . Migraines 11/05/2012  . Depression 11/05/2012  . ANXIETY 04/30/2009  . DEPRESSION 04/30/2009  . TRANSIENT ISCHEMIC ATTACK 04/30/2009  . PATENT FORAMEN OVALE 04/30/2009    Past Surgical History:  Procedure Laterality Date  . kidney stone removal  May 2012  . PATENT FORAMEN OVALE CLOSURE  12/2007    OB  History    Gravida Para Term Preterm AB Living   3 2 2     2    SAB TAB Ectopic Multiple Live Births         0 2       Home Medications    Prior to Admission medications   Medication Sig Start Date End Date Taking? Authorizing Provider  albuterol (PROVENTIL HFA;VENTOLIN HFA) 108 (90 BASE) MCG/ACT inhaler Inhale 2 puffs into the lungs every 6 (six) hours as needed for wheezing.   Yes [provider]  ibuprofen (ADVIL,MOTRIN) 600 MG tablet Take 1 tablet (600 mg total) by mouth every 6 (six) hours. Patient taking differently: Take 600 mg by mouth every 6 (six) hours as needed for moderate pain.  01/06/17  Yes Myna Hidalgo, DO  lamoTRIgine (LAMICTAL) 200 MG tablet Take 400 mg by mouth daily. 10/05/16  Yes [provider]  montelukast (SINGULAIR) 10 MG tablet Take 10 mg by mouth daily as needed (allergies).    Yes [provider]  oxyCODONE-acetaminophen (PERCOCET/ROXICET) 5-325 MG tablet Take 1 tablet by mouth every 4 (four) hours as needed (for pain scale greater than or equal to 4 and less than 7). 01/06/17  Yes Myna Hidalgo, DO    Family History Family History  Problem Relation Age of Onset  . Heart disease Unknown        No family history    Social History Social  History  Substance Use Topics  . Smoking status: Never Smoker  . Smokeless tobacco: Never Used  . Alcohol use Yes     Comment: Occasional     Allergies   Patient has no known allergies.   Review of Systems Review of Systems  Respiratory: Negative for shortness of breath.   Cardiovascular: Positive for near-syncope. Negative for chest pain.  Gastrointestinal: Negative for abdominal pain.  Neurological: Negative for headaches.   All other systems are reviewed and are negative for acute change except as noted in the HPI   Physical Exam Updated Vital Signs BP 100/66   Pulse 88   Resp 13   Ht 5\' 6"  (1.676 m)   Wt 59 kg (130 lb)   LMP 05/09/2017 Comment: had baby 4 mo's ago (Feb  18')  SpO2 99%   Breastfeeding? No   BMI 20.98 kg/m   Physical Exam  Constitutional: She is oriented to person, place, and time. She appears well-developed and well-nourished. No distress.  HENT:  Head: Normocephalic and atraumatic.  Nose: Nose normal.  Eyes: Conjunctivae and EOM are normal. Pupils are equal, round, and reactive to light. Right eye exhibits no discharge. Left eye exhibits no discharge. No scleral icterus. Right eye exhibits no nystagmus. Left eye exhibits no nystagmus.  Neck: Normal range of motion. Neck supple.  Cardiovascular: Normal rate and regular rhythm.  Exam reveals no gallop and no friction rub.   No murmur heard. Pulmonary/Chest: Effort normal and breath sounds normal. No stridor. No respiratory distress. She has no rales.  Abdominal: Soft. She exhibits no distension. There is no tenderness.  Musculoskeletal: She exhibits no edema or tenderness.  Neurological: She is alert and oriented to person, place, and time.  Skin: Skin is warm and dry. No rash noted. She is not diaphoretic. No erythema.  Psychiatric: She has a normal mood and affect.  Vitals reviewed.    ED Treatments / Results  Labs (all labs ordered are listed, but only abnormal results are displayed) Labs Reviewed - No data to display  EKG  EKG Interpretation None       Radiology No results found.  Procedures Procedures (including critical care time)  Medications Ordered in ED Medications - No data to display   Initial Impression / Assessment and Plan / ED Course  I have reviewed the triage vital signs and the nursing notes.  Pertinent labs & imaging results that were available during my care of the patient were reviewed by me and considered in my medical decision making (see chart for details).     Consistent with Lamictal side effects. EKG w/o dysrhythmia. Low suspicion for cardiac etiology. Pt declined to provide UPT, but I have low suspicion for possible ectopic pregnancy  given the lack of abd pain.  The patient is safe for discharge to her husband with strict return precautions.    Final Clinical Impressions(s) / ED Diagnoses   Final diagnoses:  Near syncope  Syncope, unspecified syncope type   Disposition: Discharge  Condition: Good  I have discussed the results, Dx and Tx plan with the patient who expressed understanding and agree(s) with the plan. Discharge instructions discussed at great length. The patient was given strict return precautions who verbalized understanding of the instructions. No further questions at time of discharge.    Discharge Medication List as of 05/16/2017  7:47 AM      Follow Up: Carilyn GoodpastureWillard, Jennifer, PA-C [address] [place] [address] [phone]  Schedule  an appointment as soon as possible for a visit        Maria Coin, Amadeo Garnet, MD 05/16/17 [phone]

## 2017-05-16 NOTE — ED Triage Notes (Addendum)
Pt in from barre workout class with c/o near syncope. Pt states she accidentally took 2 Lamictal pills this morning (total 800 mg). States she went per normal routine today, didn't have breakfast and went to her barre class. While exercising, she felt she might pass out and "drugged up". Pt takes Lamictal for depression, CVA 10 yrs ago.

## 2017-05-16 NOTE — ED Notes (Signed)
EDP at bedside  

## 2018-12-05 ENCOUNTER — Encounter: Payer: Self-pay | Admitting: Neurology

## 2019-01-29 NOTE — Progress Notes (Deleted)
NEUROLOGY CONSULTATION NOTE  [name] MRN: [id number] DOB: 29-May-1986  Referring provider: Carilyn Goodpasture, PA-C Primary care provider: Carilyn Goodpasture, PA-C  Reason for consult:  migraines  HISTORY OF PRESENT ILLNESS: [name] is a 33 year old ***-handed Caucasian woman with depression, anxiety, and history of transient ischemic attack in setting of PFO with subsequent closure (2009) who presents for migraines.  History supplemented by referring provider note.  Onset:  *** Location:  *** Quality:  *** Intensity:  ***.  *** denies new headache, thunderclap headache or severe headache that wakes *** from sleep. Aura:  *** Premonitory Phase:  *** Postdrome:  *** Associated symptoms:  ***.  *** denies associated unilateral numbness or weakness. Duration:  *** Frequency:  *** Frequency of abortive medication: *** Triggers:  *** Exacerbating factors:  *** Relieving factors:  *** Activity:  ***  ENT workup for vertigo was negative.  She had an MRI and MRA of the head without contrast on 09/19/07 to evaluate episode of disorientation and inability to speak, which was personally reviewed.  There was no acute intracranial abnormality.  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  Current NSAIDS:  *** Current analgesics:  *** Current triptans:  Sumatriptan 50mg  Current ergotamine:  *** Current anti-emetic:  *** Current muscle relaxants:  *** Current anti-anxiolytic:  *** Current sleep aide:  melatonin Current Antihypertensive medications:  *** Current Antidepressant medications:  *** Current Anticonvulsant medications:  topiramate 25mg  twice daily, Lamotrigine 200mg  Current anti-CGRP:  *** Current Vitamins/Herbal/Supplements:  melatonin Current Antihistamines/Decongestants:  *** Other therapy:  Meclizine (vertigo) Hormone/birth control:  Norethindrone  acet-ethinyl est  Past NSAIDS:  *** Past analgesics:  *** Past abortive triptans:  *** Past abortive ergotamine:  *** Past muscle relaxants:  *** Past anti-emetic:  *** Past antihypertensive medications:  *** Past antidepressant medications:  *** Past anticonvulsant medications:  topiramate  Past anti-CGRP:  *** Past vitamins/Herbal/Supplements:  *** Past antihistamines/decongestants:  *** Other past therapies:  ***  Caffeine:  *** Alcohol:  *** Smoker:  *** Diet:  *** Exercise:  *** Depression:  ***; Anxiety:  *** Other pain:  *** Sleep hygiene:  *** Family history of headache:  ***  PAST MEDICAL HISTORY: Past Medical History:  Diagnosis Date  . ANXIETY   . Asthma last used inhaler 08/03/13   Seasonal asthma  . DEPRESSION   . Kidney stone may 2012  . PATENT FORAMEN OVALE   . Stroke (HCC) 10/2007  . TRANSIENT ISCHEMIC ATTACK     PAST SURGICAL HISTORY: Past Surgical History:  Procedure Laterality Date  . kidney stone removal  May 2012  . PATENT FORAMEN OVALE CLOSURE  12/2007    MEDICATIONS: Current Outpatient Medications on File Prior to Visit  Medication Sig Dispense Refill  . albuterol (PROVENTIL HFA;VENTOLIN HFA) 108 (90 BASE) MCG/ACT inhaler Inhale 2 puffs into the lungs every 6 (six) hours as needed for wheezing.    Marland Kitchen ibuprofen (ADVIL,MOTRIN) 600 MG tablet Take 1 tablet (600 mg total) by mouth every 6 (six) hours. (Patient taking differently: Take 600 mg by mouth every 6 (six) hours as needed for moderate pain. ) 30 tablet 0  . lamoTRIgine (LAMICTAL) 200 MG tablet Take 400 mg by mouth daily.  12  . montelukast (SINGULAIR) 10 MG tablet Take 10 mg by mouth daily as needed (allergies).     Marland Kitchen oxyCODONE-acetaminophen (PERCOCET/ROXICET) 5-325 MG tablet Take  1 tablet by mouth every 4 (four) hours as needed (for pain scale greater than or equal to 4 and less than 7). 5 tablet 0   No current facility-administered medications on file prior to visit.      ALLERGIES: No Known Allergies  FAMILY HISTORY: Family History  Problem Relation Age of Onset  . Heart disease Unknown        No family history   ***.  SOCIAL HISTORY: Social History   Socioeconomic History  . Marital status: Married    Spouse name: Not on file  . Number of children: Not on file  . Years of education: Not on file  . Highest education level: Not on file  Occupational History  . Not on file  Social Needs  . Financial resource strain: Not on file  . Food insecurity:    Worry: Not on file    Inability: Not on file  . Transportation needs:    Medical: Not on file    Non-medical: Not on file  Tobacco Use  . Smoking status: Never Smoker  . Smokeless tobacco: Never Used  Substance and Sexual Activity  . Alcohol use: Yes    Comment: Occasional  . Drug use: No  . Sexual activity: Yes    Partners: Male    Birth control/protection: None  Lifestyle  . Physical activity:    Days per week: Not on file    Minutes per session: Not on file  . Stress: Not on file  Relationships  . Social connections:    Talks on phone: Not on file    Gets together: Not on file    Attends religious service: Not on file    Active member of club or organization: Not on file    Attends meetings of clubs or organizations: Not on file    Relationship status: Not on file  . Intimate partner violence:    Fear of current or ex partner: Not on file    Emotionally abused: Not on file    Physically abused: Not on file    Forced sexual activity: Not on file  Other Topics Concern  . Not on file  Social History Narrative  . Not on file    REVIEW OF SYSTEMS: Constitutional: No fevers, chills, or sweats, no generalized fatigue, change in appetite Eyes: No visual changes, double vision, eye pain Ear, nose and throat: No hearing loss, ear pain, nasal congestion, sore throat Cardiovascular: No chest pain, palpitations Respiratory:  No shortness of breath at rest or with exertion,  wheezes GastrointestinaI: No nausea, vomiting, diarrhea, abdominal pain, fecal incontinence Genitourinary:  No dysuria, urinary retention or frequency Musculoskeletal:  No neck pain, back pain Integumentary: No rash, pruritus, skin lesions Neurological: as above Psychiatric: No depression, insomnia, anxiety Endocrine: No palpitations, fatigue, diaphoresis, mood swings, change in appetite, change in weight, increased thirst Hematologic/Lymphatic:  No purpura, petechiae. Allergic/Immunologic: no itchy/runny eyes, nasal congestion, recent allergic reactions, rashes  PHYSICAL EXAM: *** General: No acute distress.  Patient appears ***-groomed.  *** Head:  Normocephalic/atraumatic Eyes:  fundi examined but not visualized Neck: supple, no paraspinal tenderness, full range of motion Back: No paraspinal tenderness Heart: regular rate and rhythm Lungs: Clear to auscultation bilaterally. Vascular: No carotid bruits. Neurological Exam: Mental status: alert and oriented to person, place, and time, recent and remote memory intact, fund of knowledge intact, attention and concentration intact, speech fluent and not dysarthric, language intact. Cranial nerves: CN I: not tested CN II: pupils equal, round  and reactive to light, visual fields intact CN III, IV, VI:  full range of motion, no nystagmus, no ptosis CN V: facial sensation intact CN VII: upper and lower face symmetric CN VIII: hearing intact CN IX, X: gag intact, uvula midline CN XI: sternocleidomastoid and trapezius muscles intact CN XII: tongue midline Bulk & Tone: normal, no fasciculations. Motor:  5/5 throughout *** Sensation:  Pinprick *** temperature *** and vibration sensation intact.  ***. Deep Tendon Reflexes:  2+ throughout, *** toes downgoing.  *** Finger to nose testing:  Without dysmetria.  *** Heel to shin:  Without dysmetria.  *** Gait:  Normal station and stride.  Able to turn and tandem walk. Romberg  ***.  IMPRESSION: ***  PLAN: ***  Thank you for allowing me to take part in the care of this patient.  Shon Millet, DO  CC: ***

## 2019-02-03 ENCOUNTER — Ambulatory Visit: Payer: Managed Care, Other (non HMO) | Admitting: Neurology

## 2019-02-03 ENCOUNTER — Encounter: Payer: Self-pay | Admitting: Neurology

## 2019-09-18 ENCOUNTER — Emergency Department (HOSPITAL_COMMUNITY)
Admission: EM | Admit: 2019-09-18 | Discharge: 2019-09-18 | Discharge status: Against Medical Advice | Payer: Managed Care, Other (non HMO)

## 2019-09-18 NOTE — ED Notes (Signed)
Patient stated that she was going to her doctor

## 2019-09-19 ENCOUNTER — Other Ambulatory Visit: Payer: Self-pay | Admitting: Family Medicine

## 2019-09-19 ENCOUNTER — Ambulatory Visit
Admission: RE | Admit: 2019-09-19 | Discharge: 2019-09-19 | Disposition: A | Discharge status: Home / Self Care | Payer: Managed Care, Other (non HMO) | Source: Ambulatory Visit | Attending: Family Medicine | Admitting: Family Medicine

## 2019-09-19 DIAGNOSIS — R109 Unspecified abdominal pain: Secondary | ICD-10-CM

## 2019-09-19 MED ORDER — IOPAMIDOL (ISOVUE-300) INJECTION 61%
100.0000 mL | Freq: Once | INTRAVENOUS | Status: AC | PRN
  Administered 2019-09-19: 100 mL via INTRAVENOUS

## 2019-12-07 IMAGING — CT CT ABD-PELV W/ CM
2 of 4 series · 15 of 46 positions shown, 17 images · IV contrast (iopamidol)
Comparison: None.

CLINICAL DATA: Mid lower abdominal pain for the past 2 weeks.
History of nephrolithiasis. Evaluate for diverticulitis.

EXAM:
CT ABDOMEN AND PELVIS WITH CONTRAST
TECHNIQUE: Multidetector CT imaging of the abdomen and pelvis was performed
using the standard protocol following bolus administration of
intravenous contrast.
CONTRAST:  100mL [id number] IOPAMIDOL ([id number]) INJECTION 61%

[Series 2: abd pelvis 5.00 br40 s3 axial · axial · 0.66mm/px · z∈[+1242,+1642]mm · 12 of 92 slices shown, 14 images]
[im 8/92  soft-tissue]
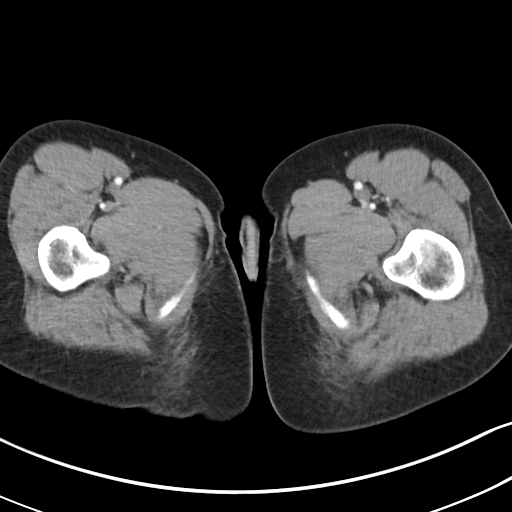
[im 8/92  bone]
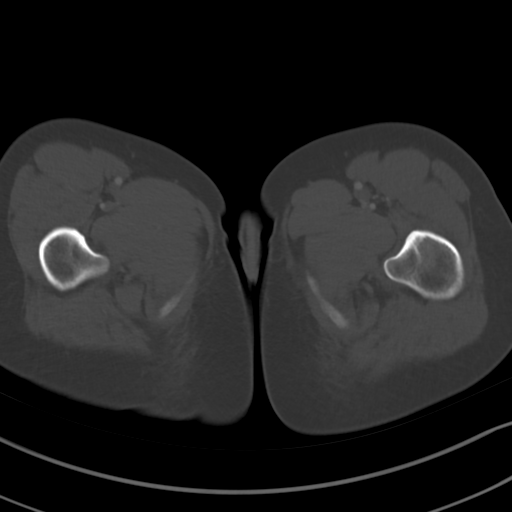
[im 15/92  soft-tissue]
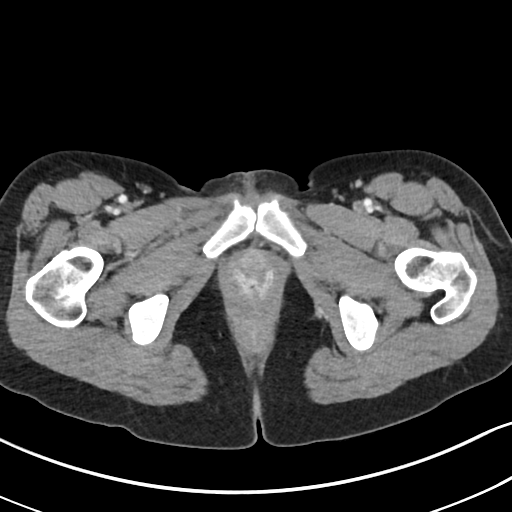
[im 22/92  soft-tissue]
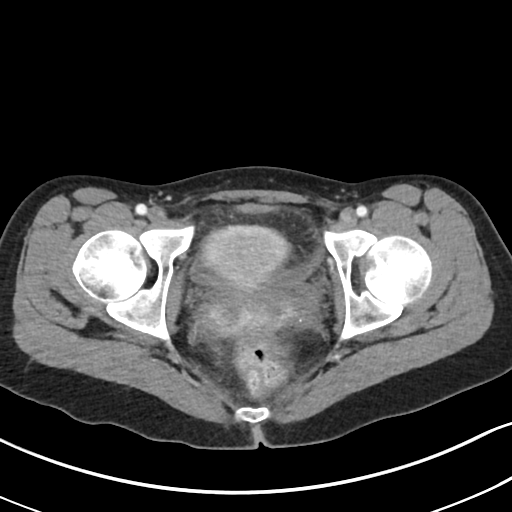
[im 30/92  soft-tissue]
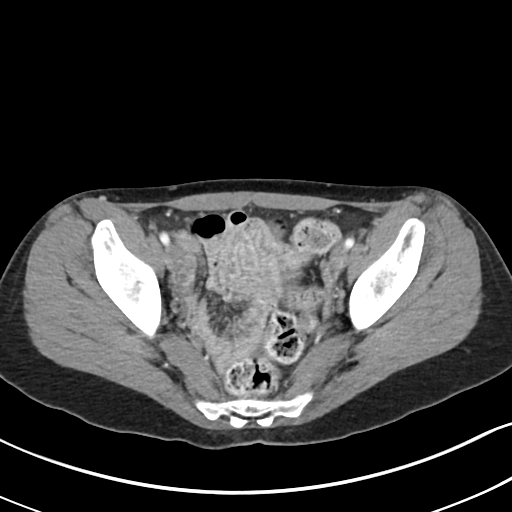
[im 37/92  soft-tissue]
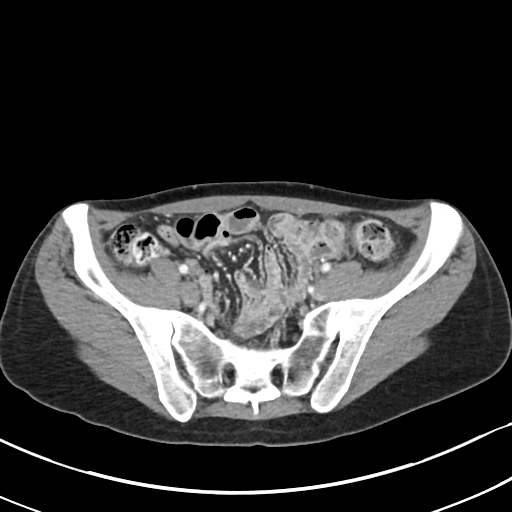
[im 44/92  soft-tissue]
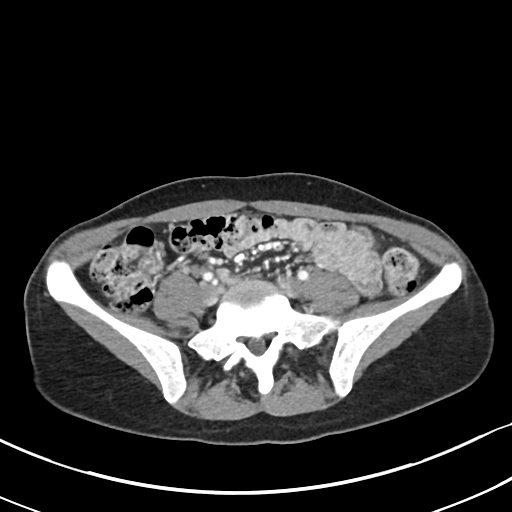
[im 51/92  soft-tissue]
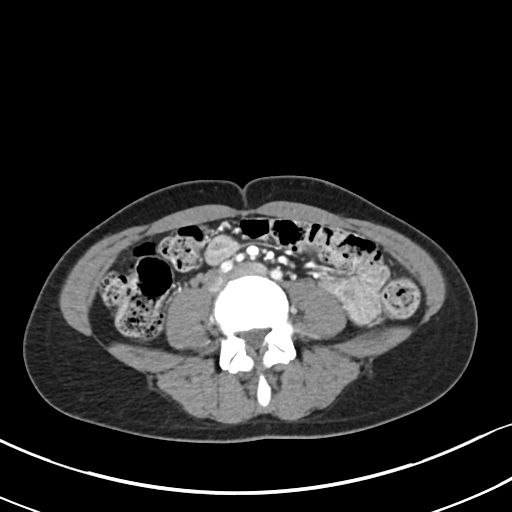
[im 59/92  soft-tissue]
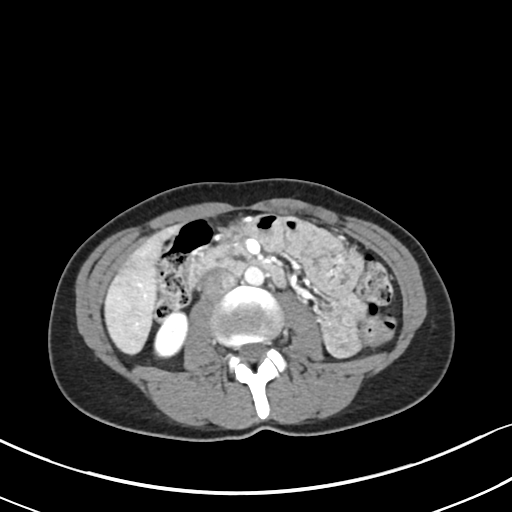
[im 66/92  soft-tissue]
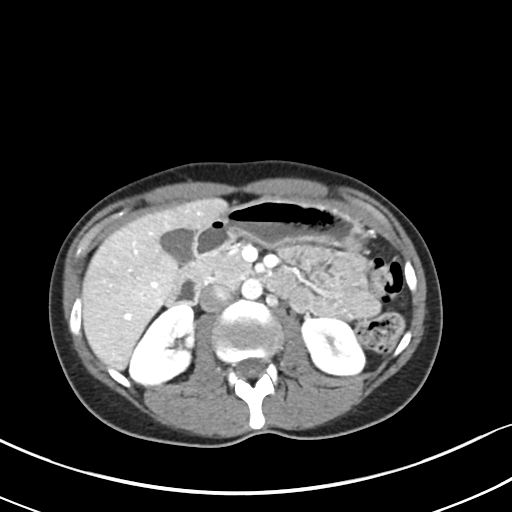
[im 66/92  bone]
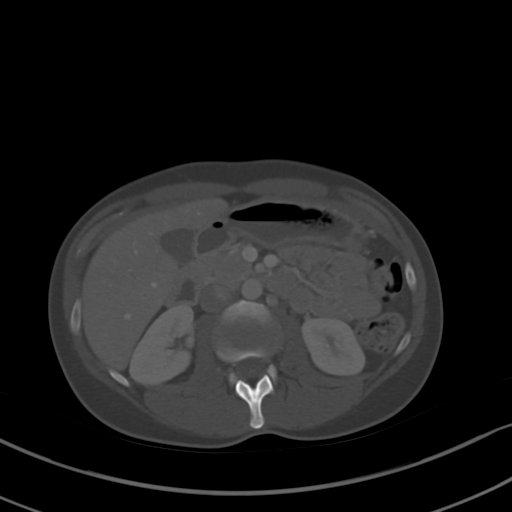
[im 73/92  soft-tissue]
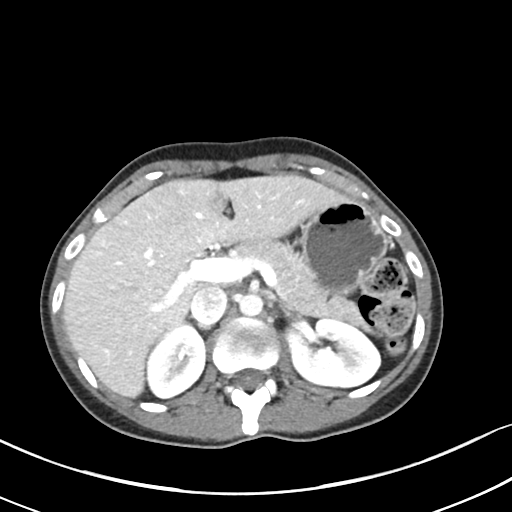
[im 81/92  soft-tissue]
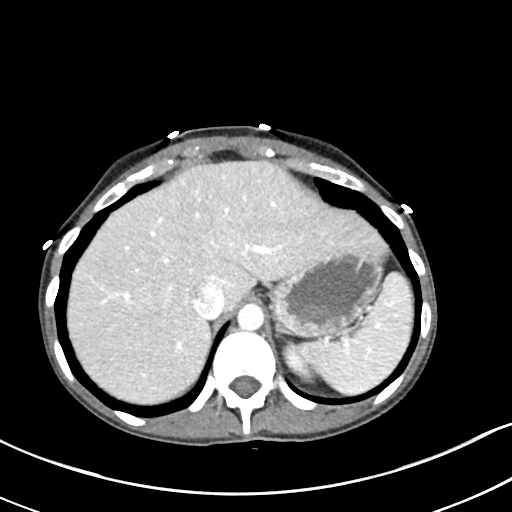
[im 88/92  soft-tissue]
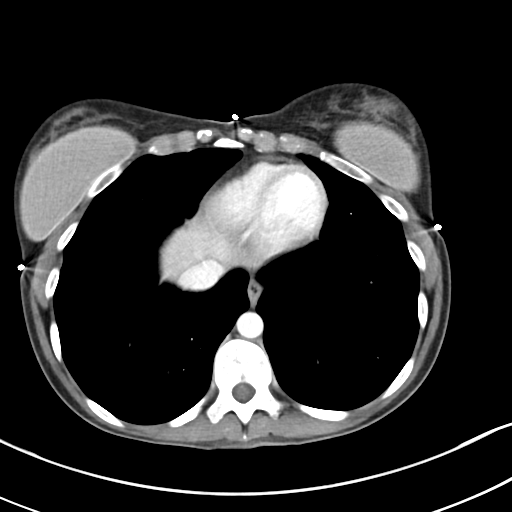

[Series 6: abd pelvis 2.00 br40 s3 cor · coronal · 0.66mm/px · 3 of 117 slices shown]
[im 39/117  soft-tissue]
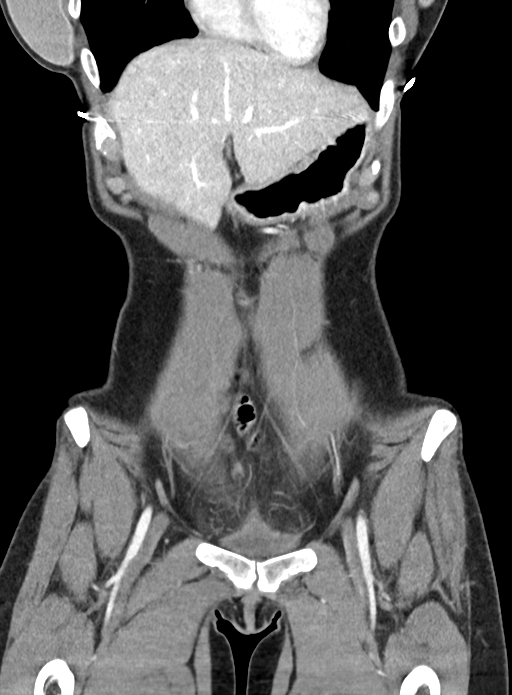
[im 52/117  soft-tissue]
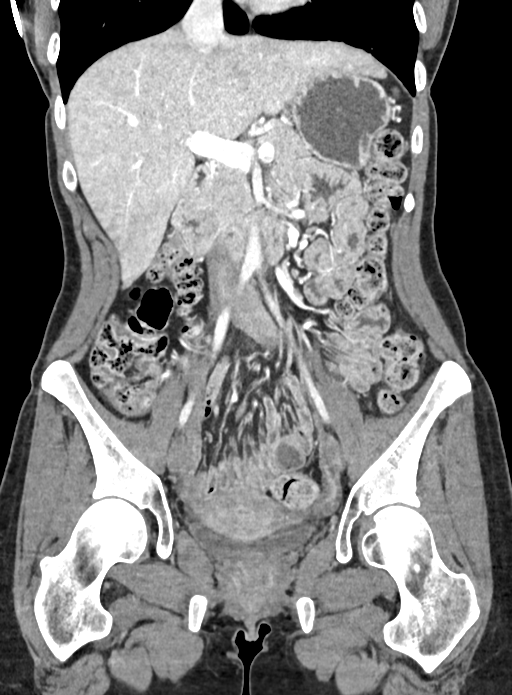
[im 65/117  soft-tissue]
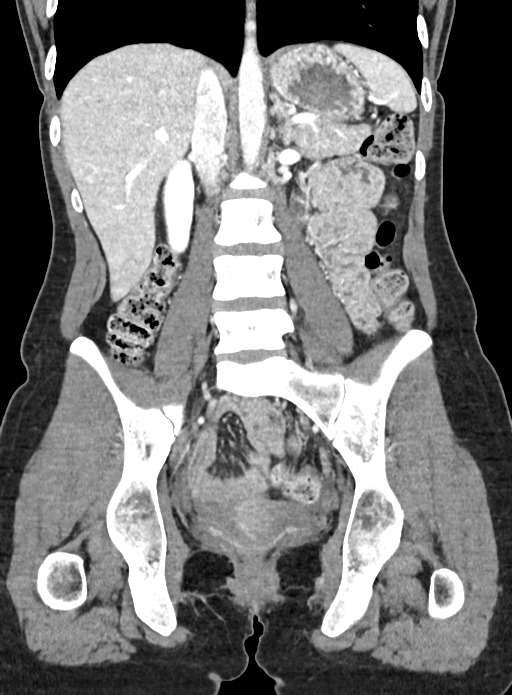

[15 of 46 positions shown; findings below may reference images not displayed]

FINDINGS: Lower chest: Limited visualization of the lower thorax is negative
for focal airspace opacity or pleural effusion. Normal heart size.
No pericardial effusion.

Hepatobiliary: Normal hepatic contour. There is a minimal amount of
focal fatty infiltration adjacent to the fissure for the ligamentum
teres. Somewhat mottled appearance involving the caudal aspect of
the right lobe of the liver (representative image 59, series 6) is
without associated lesion or nodule or perihepatic stranding is thus
favored to be perfusional in etiology. No discrete worrisome hepatic
lesions. Normal appearance of the gallbladder given degree
distention. No radiopaque gallstones. No intra or extrahepatic
biliary duct dilatation.

Pancreas: Normal appearance of the pancreas.

Spleen: Normal appearance of the spleen.

Adrenals/Urinary Tract: There is symmetric enhancement of the
bilateral kidneys. There is a solitary punctate (2 mm)
nonobstructing stone within the inferior pole of the left kidney
(coronal image 82, series 6). No discrete right-sided renal stones.
No discrete renal lesions. No urine obstruction or perinephric
stranding. Normal appearance the bilateral adrenal glands. Normal
appearance of the urinary bladder given underdistention.

Stomach/Bowel: Ingested enteric contrast extends to the level of the
rectum. No evidence of enteric obstruction. Large colonic stool
burden. No significant diverticular disease normal appearance of the
terminal ileum and appendix. No discrete areas of bowel wall
thickening. No hiatal hernia. No pneumoperitoneum, pneumatosis or
portal venous gas.

Vascular/Lymphatic: Normal caliber the abdominal aorta. The major
branch vessels of the abdominal aorta appear patent on this non CTA
examination. Incidental note made of a circumaortic left renal vein.

No bulky retroperitoneal, mesenteric, pelvic or inguinal lymph
adenopathy.

Reproductive: Normal appearance of the pelvic organs. No discrete
adnexal lesion. There is a minimal amount of presumably physiologic
free fluid in the pelvic cul-de-sac.

Other: Regional soft tissues appear normal. Post bilateral breast
augmentation.

Musculoskeletal: No acute or aggressive osseous abnormalities.
IMPRESSION: 1. No explanation for patient's mid line lower abdominal pain.
Specifically, no evidence of diverticulitis, appendicitis, enteric
or urinary obstruction.
2. Somewhat mottled enhancement involving the caudal aspect of the
right lobe of the liver is favored to be perfusional in etiology.
Correlation with LFTs is advised.
3. Solitary punctate (2 mm) nonobstructing left-sided
nephrolithiasis.
4. Small amount of presumably free fluid within the pelvic
cul-de-sac.

## 2020-06-11 ENCOUNTER — Ambulatory Visit
Admission: RE | Admit: 2020-06-11 | Discharge: 2020-06-11 | Disposition: A | Discharge status: Home / Self Care | Payer: Managed Care, Other (non HMO) | Source: Ambulatory Visit | Attending: Family Medicine | Admitting: Family Medicine

## 2020-06-11 ENCOUNTER — Other Ambulatory Visit: Payer: Self-pay | Admitting: Family Medicine

## 2020-06-11 DIAGNOSIS — R059 Cough, unspecified: Secondary | ICD-10-CM

## 2020-07-27 NOTE — Progress Notes (Signed)
New Patient Note  RE: [name] MRN: [id number] DOB: 03-30-1986 Date of Office Visit: 07/28/2020  Referring provider: Mila Palmer, MD Primary care provider: Farris Has, MD  Chief Complaint: Sinus Problem (1 week ago, mountain trip cause flare up ), Sore Throat, and Nasal Congestion  History of Present Illness: I had the pleasure of seeing [name] for initial evaluation at the [facility] on 07/29/2020. She is a 34 y.o. female, who is referred here by Farris Has, MD for the evaluation of rhinitis.  She reports symptoms of nasal congestion, sore throat, PND, coughing, sneezing, watery eyes. Symptoms have been going on for many years. The symptoms are present all year around with worsening in spring. Other triggers include exposure to pollen. Anosmia: no. Headache: sometimes. She has used Claritin, Flonase with some improvement in symptoms. she was also taking Mucinex at times. Sinus infections: 3 courses wit some benefit. Previous work up includes: none. Patient was on prednisone for about 32 days in the spring.  Previous ENT evaluation: no. Previous sinus imaging: no. History of nasal polyps: no. Last eye exam: February 2021. History of reflux: no. Symptoms worse the past year since working from home. They do have 1 dog at home who sleeps in the bedroom with her.   Assessment and Plan: Jonathan is a 34 y.o. female with: Other allergic rhinitis Perennial rhinoconjunctivitis symptoms for many years but worse the last year and during the spring.  Tried Claritin and Flonase with minimal benefit.  Had 3 courses of antibiotics and prednisone for 32 days in the spring.  1 dog at home.  Today's skin testing showed: Positive to grass, ragweed, weed, trees, mold, dust mites, dog.  Declined cat intradermal testing. Results given.   Start environmental control measures as below.  May use over the counter antihistamines such as Zyrtec (cetirizine),  Claritin (loratadine), Allegra (fexofenadine), or Xyzal (levocetirizine) daily as needed. May take twice a day if needed.  May use Flonase (fluticasone) nasal spray 1 spray per nostril twice a day as needed for nasal congestion.   May use azelastine nasal spray 1-2 sprays per nostril twice a day as needed for runny nose/drainage. Start Singulair (montelukast)  daily at night. Cautioned that in some children/adults can experience behavioral changes including hyperactivity, agitation, depression, sleep disturbances and suicidal ideations. These side effects are rare, but if you notice them you should notify me and discontinue Singulair (montelukast).  Had a detailed discussion with patient/family that clinical history is suggestive of allergic rhinitis, and may benefit from allergy immunotherapy (AIT). Discussed in detail regarding the dosing, schedule, side effects (mild to moderate local allergic reaction and rarely systemic allergic reactions including anaphylaxis), and benefits (significant improvement in nasal symptoms, seasonal flares of asthma) of immunotherapy with the patient. There is significant time commitment involved with allergy shots, which includes weekly immunotherapy injections for first 9-12 months and then biweekly to monthly injections for 3-5 years. Consent was signed.  Start allergy injections.  I have prescribed epinephrine injectable and demonstrated proper use. For mild symptoms you can take over the counter antihistamines such as Benadryl and monitor symptoms closely. If symptoms worsen or if you have severe symptoms including breathing issues, throat closure, significant swelling, whole body hives, severe diarrhea and vomiting, lightheadedness then inject epinephrine and seek immediate medical care afterwards.  Mild intermittent reactive airway disease Episodes of chest tightness coughing and wheezing for 20+ years.  Using albuterol less than once a week.  Main  triggers  are allergies and infections.  Had a prolonged course of prednisone in the spring this year.  06/11/2020 chest x-ray was unremarkable.  Today's spirometry was normal.  She most likely has some reactive airway disease component.  May use albuterol rescue inhaler 2 puffs every 4 to 6 hours as needed for shortness of breath, chest tightness, coughing, and wheezing. May use albuterol rescue inhaler 2 puffs 5 to 15 minutes prior to strenuous physical activities. Monitor frequency of use.   Adverse food reaction Currently avoiding seafood and shellfish due to episodes of vomiting after eating oysters and mussels in the past.  Denies any other associated symptoms.  Otherwise tolerates a varied diet with no issues.  No prior work-up.  Today's skin testing was negative to common foods, seafood, shellfish and mollusks.  Discussed with patient trying seafood and shellfish at home given her history.  Return in about 6 months (around 01/28/2021).  Meds ordered this encounter  Medications  . Azelastine HCl 0.15 % SOLN    Sig: May use azelastine nasal spray 1-2 sprays per nostril twice a day as needed for runny nose/drainage.    Dispense:  30 mL    Refill:  5  . montelukast (SINGULAIR) 10 MG tablet    Sig: Take 1 tablet (10 mg total) by mouth at bedtime.    Dispense:  30 tablet    Refill:  5  . EPINEPHrine (EPIPEN 2-PAK) 0.3 mg/0.3 mL IJ SOAJ injection    Sig: Inject 0.3 mLs (0.3 mg total) into the muscle once for 1 dose.    Dispense:  2 each    Refill:  2    Dispense generic Mylan or teva brand   Other allergy screening: Asthma: yes  She reports symptoms of chest tightness, coughing, wheezing for 20+ years. Current medications include albuterol prn which help. She reports not using aerochamber with inhalers. She tried the following inhalers: none. Main triggers are allergies, infections. In the last month, frequency of symptoms: <1x/week. Frequency of nocturnal symptoms: 0x/month. Frequency of SABA  use: <1x/week. Interference with physical activity: no. Sleep is undisturbed. In the last 12 months, emergency room visits/urgent care visits or hospitalizations due to respiratory issues: 1. In the last 12 months, oral steroids courses: prolonged prednisone course of 32 days. Lifetime history of hospitalization for respiratory issues: 0. Prior intubations: no. Asthma was diagnosed at age 34. History of pneumonia: no. She was not evaluated by allergist/pulmonologist in the past. Smoking exposure: no. Up to date with flu vaccine: yes. Up to date with COVID-19 vaccine: yes.   06/11/2020 IMPRESSION: No active cardiopulmonary disease.  Food allergy: no  Currently avoiding seafood as she had few episodes of vomiting after eating oysters and mussels.   Past work up includes:none. Dietary History: patient has been eating other foods including milk, eggs, peanut, treenuts, sesame, soy, wheat, meats, fruits and vegetables.  She reports reading labels and avoiding seafood/shellfish in diet completely.   Sometimes wine causes some redness on her chest.  Medication allergy: no Hymenoptera allergy: Large localized reactions.  Urticaria: no Eczema:no History of recurrent infections suggestive of immunodeficency: no  Diagnostics: Spirometry:  Tracings reviewed. Her effort: Good reproducible efforts. FVC: 4.00L FEV1: 3.15L, 95% predicted FEV1/FVC ratio: 79% Interpretation: Spirometry consistent with normal pattern.  Please see scanned spirometry results for details.  Skin Testing: Environmental allergy panel and select foods. Negative to common foods, seafood, shellfish and mollusks. Positive to grass, ragweed, weed, trees, mold, dust mites, dog.  Results discussed  with patient/family.  Airborne Adult Perc - 07/28/20 1419    Time Antigen Placed 1419    Allergen Manufacturer Waynette Buttery    Location Back    Number of Test 59    Panel 1 Select    1. Control-Buffer 50% Glycerol Negative    2.  Control-Histamine 1 mg/ml 2+    3. Albumin saline Negative    4. Bahia 2+    5. [place] 3+    6. Johnson 2+    7. Kentucky Blue 3+    8. Meadow Fescue 2+    9. Perennial Rye 3+    10. Sweet Vernal 3+    11. Timothy 4+    12. Cocklebur Negative    13. Burweed Marshelder Negative    14. Ragweed, short 2+    15. Ragweed, Giant Negative    16. Plantain,  English --   +/-   17. Lamb's Quarters Negative    18. Sheep Sorrell Negative    19. Rough Pigweed Negative    20. Marsh Elder, Rough Negative    21. Mugwort, Common Negative    22. Ash mix 2+    23. Birch mix 4+    24. Beech American 2+    25. Box, Elder Negative    26. Cedar, red Negative    27. Cottonwood, Guinea-Bissau Negative    28. Elm mix Negative    29. Hickory 2+    30. Maple mix Negative    31. Oak, Guinea-[place]issau mix 2+    32. Pecan Pollen 3+    33. Pine mix Negative    34. Sycamore Eastern Negative    35. Walnut, Black Pollen 2+    36. Alternaria alternata Negative    37. Cladosporium Herbarum Negative    38. Aspergillus mix Negative    39. Penicillium mix Negative    40. Bipolaris sorokiniana (Helminthosporium) Negative    41. Drechslera spicifera (Curvularia) Negative    42. Mucor plumbeus 2+    43. Fusarium moniliforme Negative    44. Aureobasidium pullulans (pullulara) Negative    45. Rhizopus oryzae Negative    46. Botrytis cinera 2+    47. Epicoccum nigrum 2+    48. Phoma betae Negative    49. Candida Albicans Negative    50. Trichophyton mentagrophytes Negative    51. Mite, D Farinae  5,000 AU/ml 4+    52. Mite, D Pteronyssinus  5,000 AU/ml 4+    53. Cat Hair 10,000 BAU/ml Negative    54.  Dog Epithelia 2+    55. Mixed Feathers Negative    56. Horse Epithelia Negative    57. Cockroach, German Negative    58. Mouse Negative    59. Tobacco Leaf Negative          Intradermal - 07/28/20 1503    Time Antigen Placed 1503    Allergen Manufacturer Waynette Buttery    Location Arm    Number of Test 7    Intradermal  Select    Control Negative    Weed mix 3+    Mold 1 Negative    Mold 2 2+    Mold 4 2+    Cat Omitted    Cockroach Negative          Food Adult Perc - 07/28/20 1400    Time Antigen Placed 1419    Allergen Manufacturer Waynette Buttery    Location Back    Number of allergen test 21    Panel 2 Select  Control-Histamine 1 mg/ml 2+    1. Peanut Negative    2. Soybean Negative    3. Wheat Negative    4. Sesame Negative    5. Milk, cow Negative    6. Egg White, Chicken Negative    7. Casein Negative    8. Shellfish Mix Negative    9. Fish Mix Negative    10. Cashew Negative    18. Catfish Negative    19. Bass Negative    20. Trout Negative    21. Tuna Negative    23. Flounder Negative    24. Codfish Negative    25. Shrimp Negative    26. Crab Negative    27. Lobster Negative    28. Oyster Negative    29. Scallops Negative           Past Medical History: Patient Active Problem List   Diagnosis Date Noted  . Other allergic rhinitis 07/29/2020  . Mild intermittent reactive airway disease 07/29/2020  . Adverse food reaction 07/29/2020  . Normal intrauterine pregnancy in third trimester 01/05/2017  . VBAC (vaginal birth after Cesarean) 01/05/2017  . Chronic fatigue 05/14/2014  . DOE (dyspnea on exertion) 05/14/2014  . Encounter for preconception consultation 11/05/2012  . TIA (transient ischemic attack) 11/05/2012  . Patent foramen ovale 11/05/2012  . Migraines 11/05/2012  . Depression 11/05/2012  . ANXIETY 04/30/2009  . DEPRESSION 04/30/2009  . TRANSIENT ISCHEMIC ATTACK 04/30/2009  . PATENT FORAMEN OVALE 04/30/2009   Past Medical History:  Diagnosis Date  . ANXIETY   . Asthma last used inhaler 08/03/13   Seasonal asthma  . DEPRESSION   . Kidney stone may 2012  . PATENT FORAMEN OVALE   . Stroke (HCC) 10/2007  . TRANSIENT ISCHEMIC ATTACK    Past Surgical History: Past Surgical History:  Procedure Laterality Date  . kidney stone removal  May 2012  . PATENT  FORAMEN OVALE CLOSURE  12/2007   Medication List:  Current Outpatient Medications  Medication Sig Dispense Refill  . albuterol (PROVENTIL HFA;VENTOLIN HFA) 108 (90 BASE) MCG/ACT inhaler Inhale 2 puffs into the lungs every 6 (six) hours as needed for wheezing.    Colleen Can 1/20 1-20 MG-MCG tablet Take 1 tablet by mouth daily.    Marland Kitchen lamoTRIgine (LAMICTAL) 200 MG tablet Take 400 mg by mouth daily.  12  . loratadine (CLARITIN) 10 MG tablet Take by mouth.    . Azelastine HCl 0.15 % SOLN May use azelastine nasal spray 1-2 sprays per nostril twice a day as needed for runny nose/drainage. 30 mL 5  . montelukast (SINGULAIR) 10 MG tablet Take 1 tablet (10 mg total) by mouth at bedtime. 30 tablet 5   No current facility-administered medications for this visit.   Allergies: No Known Allergies Social History: Social History   Socioeconomic History  . Marital status: Married    Spouse name: Not on file  . Number of children: Not on file  . Years of education: Not on file  . Highest education level: Not on file  Occupational History  . Not on file  Tobacco Use  . Smoking status: Never Smoker  . Smokeless tobacco: Never Used  Substance and Sexual Activity  . Alcohol use: Yes    Comment: Occasional  . Drug use: No  . Sexual activity: Yes    Partners: Male    Birth control/protection: None  Other Topics Concern  . Not on file  Social History Narrative  . Not on file  Social Determinants of Health   Financial Resource Strain:   . Difficulty of Paying Living Expenses: Not on file  Food Insecurity:   . Worried About Programme researcher, broadcasting/film/video in the Last Year: Not on file  . Ran Out of Food in the Last Year: Not on file  Transportation Needs:   . Lack of Transportation (Medical): Not on file  . Lack of Transportation (Non-Medical): Not on file  Physical Activity:   . Days of Exercise per Week: Not on file  . Minutes of Exercise per Session: Not on file  Stress:   . Feeling of Stress : Not  on file  Social Connections:   . Frequency of Communication with Friends and Family: Not on file  . Frequency of Social Gatherings with Friends and Family: Not on file  . Attends Religious Services: Not on file  . Active Member of Clubs or Organizations: Not on file  . Attends Banker Meetings: Not on file  . Marital Status: Not on file   Lives in a 35 year old home. Smoking: denies Occupation: Chief Strategy Officer HistorySurveyor, minerals in the house: no Engineer, civil (consulting) in the family room: no Carpet in the bedroom: yes Heating: electric Cooling: central Pet: yes 1 dog x 7 yrs  Family History: Family History  Problem Relation Age of Onset  . Heart disease Other        No family history  . Asthma Father    Review of Systems  Constitutional: Negative for appetite change, chills, fever and unexpected weight change.  HENT: Positive for congestion and postnasal drip. Negative for rhinorrhea.   Eyes: Negative for itching.  Respiratory: Positive for cough. Negative for chest tightness, shortness of breath and wheezing.   Cardiovascular: Negative for chest pain.  Gastrointestinal: Negative for abdominal pain.  Genitourinary: Negative for difficulty urinating.  Skin: Negative for rash.  Allergic/Immunologic: Positive for environmental allergies.  Neurological: Positive for headaches.   Objective: BP 92/60   Pulse 75   Resp 18   Ht 5\' 6"  (1.676 m)   Wt 129 lb 3.2 oz (58.6 kg)   SpO2 100%   BMI 20.85 kg/m  Body mass index is 20.85 kg/m. Physical Exam Vitals and nursing note reviewed.  Constitutional:      Appearance: Normal appearance. She is well-developed.  HENT:     Head: Normocephalic and atraumatic.     Right Ear: Tympanic membrane and external ear normal.     Left Ear: Tympanic membrane and external ear normal.     Nose: Nose normal.     Mouth/Throat:     Mouth: Mucous membranes are moist.     Pharynx: Oropharynx is clear.  Eyes:      Conjunctiva/sclera: Conjunctivae normal.  Cardiovascular:     Rate and Rhythm: Normal rate and regular rhythm.     Heart sounds: Normal heart sounds. No murmur heard.  No friction rub. No gallop.   Pulmonary:     Effort: Pulmonary effort is normal.     Breath sounds: Normal breath sounds. No wheezing, rhonchi or rales.  Musculoskeletal:     Cervical back: Neck supple.  Skin:    General: Skin is warm.     Findings: No rash.  Neurological:     Mental Status: She is alert and oriented to person, place, and time.  Psychiatric:        Behavior: Behavior normal.    The plan was reviewed with the patient/family, and all questions/concerned were  addressed.  It was my pleasure to see Josslin today and participate in her care. Please feel free to contact me with any questions or concerns.  Sincerely,  Wyline Mood, DO Allergy & Immunology  [facility] office: [phone] [facility] office: [phone] [place] office: [phone]

## 2020-07-28 ENCOUNTER — Ambulatory Visit (INDEPENDENT_AMBULATORY_CARE_PROVIDER_SITE_OTHER): Payer: Managed Care, Other (non HMO) | Admitting: Allergy

## 2020-07-28 ENCOUNTER — Encounter: Payer: Self-pay | Admitting: Allergy

## 2020-07-28 ENCOUNTER — Other Ambulatory Visit: Payer: Self-pay

## 2020-07-28 VITALS — BP 92/60 | HR 75 | Resp 18 | Ht 66.0 in | Wt 129.2 lb

## 2020-07-28 DIAGNOSIS — J3089 Other allergic rhinitis: Secondary | ICD-10-CM | POA: Diagnosis not present

## 2020-07-28 DIAGNOSIS — J4520 Mild intermittent asthma, uncomplicated: Secondary | ICD-10-CM | POA: Diagnosis not present

## 2020-07-28 DIAGNOSIS — T781XXD Other adverse food reactions, not elsewhere classified, subsequent encounter: Secondary | ICD-10-CM

## 2020-07-28 MED ORDER — MONTELUKAST SODIUM 10 MG PO TABS: 10.0000 mg | ORAL_TABLET | Freq: Every day | ORAL | 5 refills | Status: AC

## 2020-07-28 MED ORDER — AZELASTINE HCL 0.15 % NA SOLN: NASAL | 5 refills | Status: AC

## 2020-07-28 MED ORDER — EPINEPHRINE 0.3 MG/0.3ML IJ SOAJ
0.3000 mg | Freq: Once | INTRAMUSCULAR | 2 refills | Status: AC
Start: 2020-07-28 — End: 2020-07-28

## 2020-07-28 NOTE — Patient Instructions (Addendum)
Today's skin testing showed: Negative to common foods, seafood, shellfish and mollusks. Positive to grass, ragweed, weed, trees, mold, dust mites, dog.   Results given.   Environmental allergies  Start environmental control measures as below.  May use over the counter antihistamines such as Zyrtec (cetirizine), Claritin (loratadine), Allegra (fexofenadine), or Xyzal (levocetirizine) daily as needed. May take twice a day if needed.  May use Flonase (fluticasone) nasal spray 1 spray per nostril twice a day as needed for nasal congestion.   May use azelastine nasal spray 1-2 sprays per nostril twice a day as needed for runny nose/drainage. Start Singulair (montelukast) 10mg  daily at night. Cautioned that in some children/adults can experience behavioral changes including hyperactivity, agitation, depression, sleep disturbances and suicidal ideations. These side effects are rare, but if you notice them you should notify me and discontinue Singulair (montelukast).  Read about allergy injections.  Had a detailed discussion with patient/family that clinical history is suggestive of allergic rhinitis, and may benefit from allergy immunotherapy (AIT). Discussed in detail regarding the dosing, schedule, side effects (mild to moderate local allergic reaction and rarely systemic allergic reactions including anaphylaxis), and benefits (significant improvement in nasal symptoms, seasonal flares of asthma) of immunotherapy with the patient. There is significant time commitment involved with allergy shots, which includes weekly immunotherapy injections for first 9-12 months and then biweekly to monthly injections for 3-5 years. Consent was signed.  Start allergy injections.  I have prescribed epinephrine injectable and demonstrated proper use. For mild symptoms you can take over the counter antihistamines such as Benadryl and monitor symptoms closely. If symptoms worsen or if you have severe symptoms  including breathing issues, throat closure, significant swelling, whole body hives, severe diarrhea and vomiting, lightheadedness then inject epinephrine and seek immediate medical care afterwards.  Breathing:   May use albuterol rescue inhaler 2 puffs every 4 to 6 hours as needed for shortness of breath, chest tightness, coughing, and wheezing. May use albuterol rescue inhaler 2 puffs 5 to 15 minutes prior to strenuous physical activities. Monitor frequency of use.   Food:  May try seafood/shellfish at home.  Follow up in 6 months or sooner if needed.   Reducing Pollen Exposure  Pollen seasons: trees (spring), grass (summer) and ragweed/weeds (fall).  Keep windows closed in your home and car to lower pollen exposure.   Install air conditioning in the bedroom and throughout the house if possible.   Avoid going out in dry windy days - especially early morning.  Pollen counts are highest between 5 - 10 AM and on dry, hot and windy days.   Save outside activities for late afternoon or after a heavy rain, when pollen levels are lower.   Avoid mowing of grass if you have grass pollen allergy.  Be aware that pollen can also be transported indoors on people and pets.   Dry your clothes in an automatic dryer rather than hanging them outside where they might collect pollen.   Rinse hair and eyes before bedtime. Control of House Dust Mite Allergen  Dust mite allergens are a common trigger of allergy and asthma symptoms. While they can be found throughout the house, these microscopic creatures thrive in warm, humid environments such as bedding, upholstered furniture and carpeting.  Because so much time is spent in the bedroom, it is essential to reduce mite levels there.   Encase pillows, mattresses, and box springs in special allergen-proof fabric covers or airtight, zippered plastic covers.   Bedding should be washed weekly  in hot water (130 F) and dried in a hot dryer.  Allergen-proof covers are available for comforters and pillows that cant be regularly washed.   Wash the allergy-proof covers every few months. Minimize clutter in the bedroom. Keep pets out of the bedroom.   Keep humidity less than 50% by using a dehumidifier or air conditioning. You can buy a humidity measuring device called a hygrometer to monitor this.   If possible, replace carpets with hardwood, linoleum, or washable area rugs. If that's not possible, vacuum frequently with a vacuum that has a HEPA filter.  Remove all upholstered furniture and non-washable window drapes from the bedroom.  Remove all non-washable stuffed toys from the bedroom.  Wash stuffed toys weekly. Mold Control  Mold and fungi can grow on a variety of surfaces provided certain temperature and moisture conditions exist.   Outdoor molds grow on plants, decaying vegetation and soil. The major outdoor mold, Alternaria and Cladosporium, are found in very high numbers during hot and dry conditions. Generally, a late summer - fall peak is seen for common outdoor fungal spores. Rain will temporarily lower outdoor mold spore count, but counts rise rapidly when the rainy period ends.  The most important indoor molds are Aspergillus and Penicillium. Dark, humid and poorly ventilated basements are ideal sites for mold growth. The next most common sites of mold growth are the bathroom and the kitchen. Outdoor (Seasonal) Mold Control  Use air conditioning and keep windows closed.  Avoid exposure to decaying vegetation.  Avoid leaf raking.  Avoid grain handling.  Consider wearing a face mask if working in moldy areas.  Indoor (Perennial) Mold Control   Maintain humidity below 50%.  Get rid of mold growth on hard surfaces with water, detergent and, if necessary, 5% bleach (do not mix with other cleaners). Then dry the area completely. If mold covers an area more than 10 square feet, consider hiring an indoor environmental  professional.  For clothing, washing with soap and water is best. If moldy items cannot be cleaned and dried, throw them away.  Remove sources e.g. contaminated carpets.  Repair and seal leaking roofs or pipes. Using dehumidifiers in damp basements may be helpful, but empty the water and clean units regularly to prevent mildew from forming. All rooms, especially basements, bathrooms and kitchens, require ventilation and cleaning to deter mold and mildew growth. Avoid carpeting on concrete or damp floors, and storing items in damp areas.  Pet Allergen Avoidance:  Contrary to popular opinion, there are no hypoallergenic breeds of dogs or cats. That is because people are not allergic to an animals hair, but to an allergen found in the animal's saliva, dander (dead skin flakes) or urine. Pet allergy symptoms typically occur within minutes. For some people, symptoms can build up and become most severe 8 to 12 hours after contact with the animal. People with severe allergies can experience reactions in public places if dander has been transported on the pet owners clothing.  Keeping an animal outdoors is only a partial solution, since homes with pets in the yard still have higher concentrations of animal allergens.  Before getting a pet, ask your allergist to determine if you are allergic to animals. If your pet is already considered part of your family, try to minimize contact and keep the pet out of the bedroom and other rooms where you spend a great deal of time.  As with dust mites, vacuum carpets often or replace carpet with a hardwood floor, tile  or linoleum.  High-efficiency particulate air (HEPA) cleaners can reduce allergen levels over time.  While dander and saliva are the source of cat and dog allergens, urine is the source of allergens from rabbits, hamsters, mice and [place] pigs; so ask a non-allergic family member to clean the animals cage.  If you have a pet allergy, talk to your  allergist about the potential for allergy immunotherapy (allergy shots). This strategy can often provide long-term relief.

## 2020-07-29 ENCOUNTER — Encounter: Payer: Self-pay | Admitting: Allergy

## 2020-07-29 DIAGNOSIS — T781XXA Other adverse food reactions, not elsewhere classified, initial encounter: Secondary | ICD-10-CM | POA: Insufficient documentation

## 2020-07-29 DIAGNOSIS — J301 Allergic rhinitis due to pollen: Secondary | ICD-10-CM

## 2020-07-29 DIAGNOSIS — H1013 Acute atopic conjunctivitis, bilateral: Secondary | ICD-10-CM | POA: Insufficient documentation

## 2020-07-29 DIAGNOSIS — J4520 Mild intermittent asthma, uncomplicated: Secondary | ICD-10-CM | POA: Insufficient documentation

## 2020-07-29 DIAGNOSIS — J3089 Other allergic rhinitis: Secondary | ICD-10-CM | POA: Insufficient documentation

## 2020-07-29 NOTE — Assessment & Plan Note (Signed)
Currently avoiding seafood and shellfish due to episodes of vomiting after eating oysters and mussels in the past.  Denies any other associated symptoms.  Otherwise tolerates a varied diet with no issues.  No prior work-up.  Today's skin testing was negative to common foods, seafood, shellfish and mollusks.  Discussed with patient trying seafood and shellfish at home given her history.

## 2020-07-29 NOTE — Progress Notes (Signed)
VIALS EXP 07-29-21 °

## 2020-07-29 NOTE — Assessment & Plan Note (Signed)
Perennial rhinoconjunctivitis symptoms for many years but worse the last year and during the spring.  Tried Claritin and Flonase with minimal benefit.  Had 3 courses of antibiotics and prednisone for 32 days in the spring.  1 dog at home.  Today's skin testing showed: Positive to grass, ragweed, weed, trees, mold, dust mites, dog.  Declined cat intradermal testing. Results given.   Start environmental control measures as below.  May use over the counter antihistamines such as Zyrtec (cetirizine), Claritin (loratadine), Allegra (fexofenadine), or Xyzal (levocetirizine) daily as needed. May take twice a day if needed.  May use Flonase (fluticasone) nasal spray 1 spray per nostril twice a day as needed for nasal congestion.   May use azelastine nasal spray 1-2 sprays per nostril twice a day as needed for runny nose/drainage. Start Singulair (montelukast) 10mg  daily at night. Cautioned that in some children/adults can experience behavioral changes including hyperactivity, agitation, depression, sleep disturbances and suicidal ideations. These side effects are rare, but if you notice them you should notify me and discontinue Singulair (montelukast).  Had a detailed discussion with patient/family that clinical history is suggestive of allergic rhinitis, and may benefit from allergy immunotherapy (AIT). Discussed in detail regarding the dosing, schedule, side effects (mild to moderate local allergic reaction and rarely systemic allergic reactions including anaphylaxis), and benefits (significant improvement in nasal symptoms, seasonal flares of asthma) of immunotherapy with the patient. There is significant time commitment involved with allergy shots, which includes weekly immunotherapy injections for first 9-12 months and then biweekly to monthly injections for 3-5 years. Consent was signed.  Start allergy injections.  I have prescribed epinephrine injectable and demonstrated proper use. For mild  symptoms you can take over the counter antihistamines such as Benadryl and monitor symptoms closely. If symptoms worsen or if you have severe symptoms including breathing issues, throat closure, significant swelling, whole body hives, severe diarrhea and vomiting, lightheadedness then inject epinephrine and seek immediate medical care afterwards.

## 2020-07-29 NOTE — Assessment & Plan Note (Signed)
Episodes of chest tightness coughing and wheezing for 20+ years.  Using albuterol less than once a week.  Main triggers are allergies and infections.  Had a prolonged course of prednisone in the spring this year.  06/11/2020 chest x-ray was unremarkable.  Today's spirometry was normal.  She most likely has some reactive airway disease component.  May use albuterol rescue inhaler 2 puffs every 4 to 6 hours as needed for shortness of breath, chest tightness, coughing, and wheezing. May use albuterol rescue inhaler 2 puffs 5 to 15 minutes prior to strenuous physical activities. Monitor frequency of use.

## 2020-08-02 DIAGNOSIS — J3089 Other allergic rhinitis: Secondary | ICD-10-CM

## 2020-08-18 ENCOUNTER — Ambulatory Visit: Payer: Self-pay

## 2020-08-20 ENCOUNTER — Ambulatory Visit (INDEPENDENT_AMBULATORY_CARE_PROVIDER_SITE_OTHER): Payer: Managed Care, Other (non HMO) | Admitting: *Deleted

## 2020-08-20 ENCOUNTER — Other Ambulatory Visit: Payer: Self-pay

## 2020-08-20 DIAGNOSIS — J309 Allergic rhinitis, unspecified: Secondary | ICD-10-CM

## 2020-08-20 NOTE — Progress Notes (Signed)
Immunotherapy   Patient Details  Name: [name] MRN: [id number] Date of Birth: 30-Nov-1986  08/20/2020  [name] started injections for  G-RW-W-T, M-DM-D Following schedule: B  Frequency:1 time per week Epi-Pen:Epi-Pen Available  Consent signed and patient instructions given. Patient started allergy injections today and received 0.78mL of G-RW-W-T in the RUA and 0.65mL of M-DM-D in the LUA. Patient waited 30 minutes in an exam room and did not experience any issues.    [name] 08/20/2020, 3:33 PM

## 2020-08-27 ENCOUNTER — Ambulatory Visit (INDEPENDENT_AMBULATORY_CARE_PROVIDER_SITE_OTHER): Payer: Managed Care, Other (non HMO)

## 2020-08-27 DIAGNOSIS — J309 Allergic rhinitis, unspecified: Secondary | ICD-10-CM

## 2020-08-29 IMAGING — CR DG CHEST 2V
2 series · 2 of 2 positions shown · non-contrast
Comparison: 10/18/2015

CLINICAL DATA: Productive cough

EXAM:
CHEST - 2 VIEW

[w chest pa]
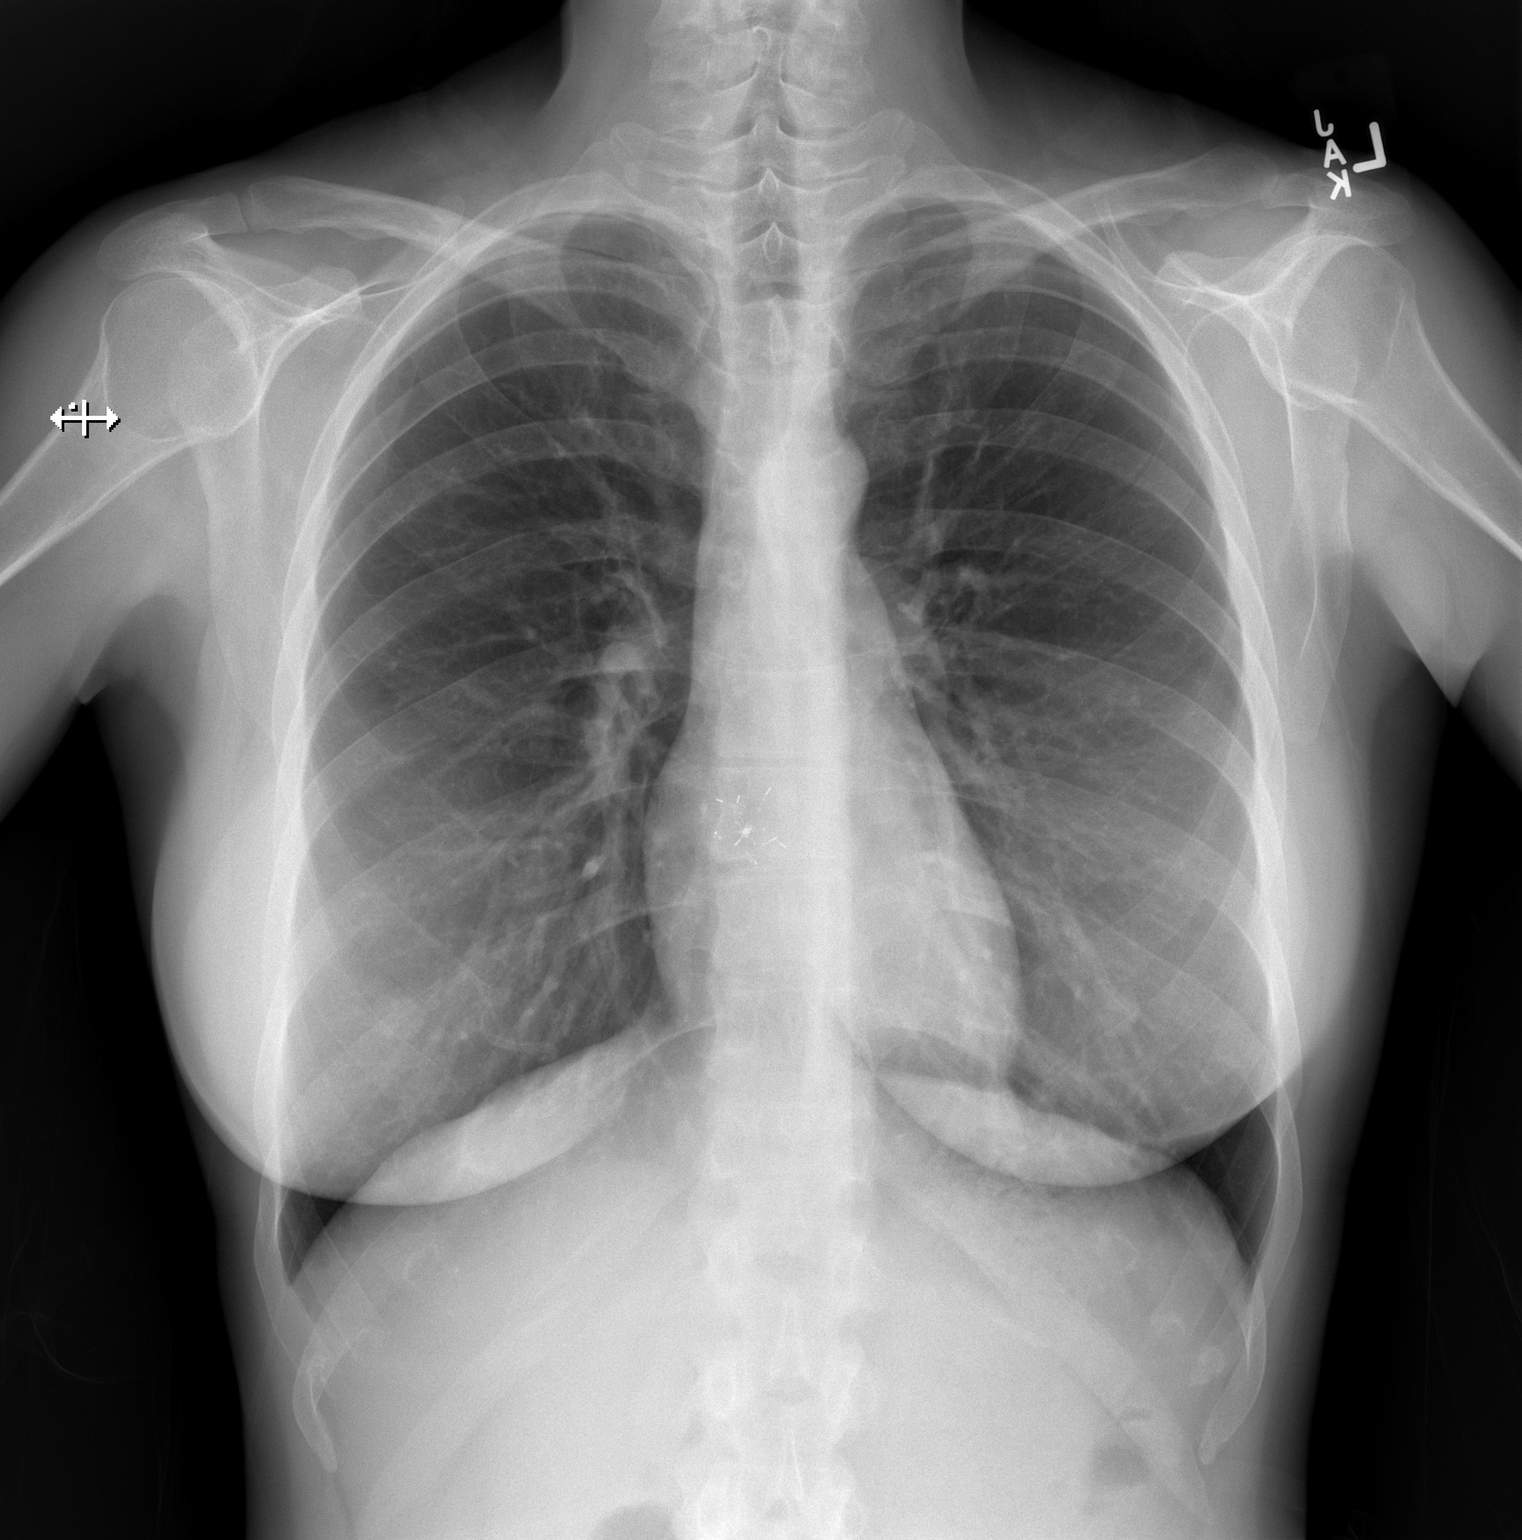

[w chest lat]
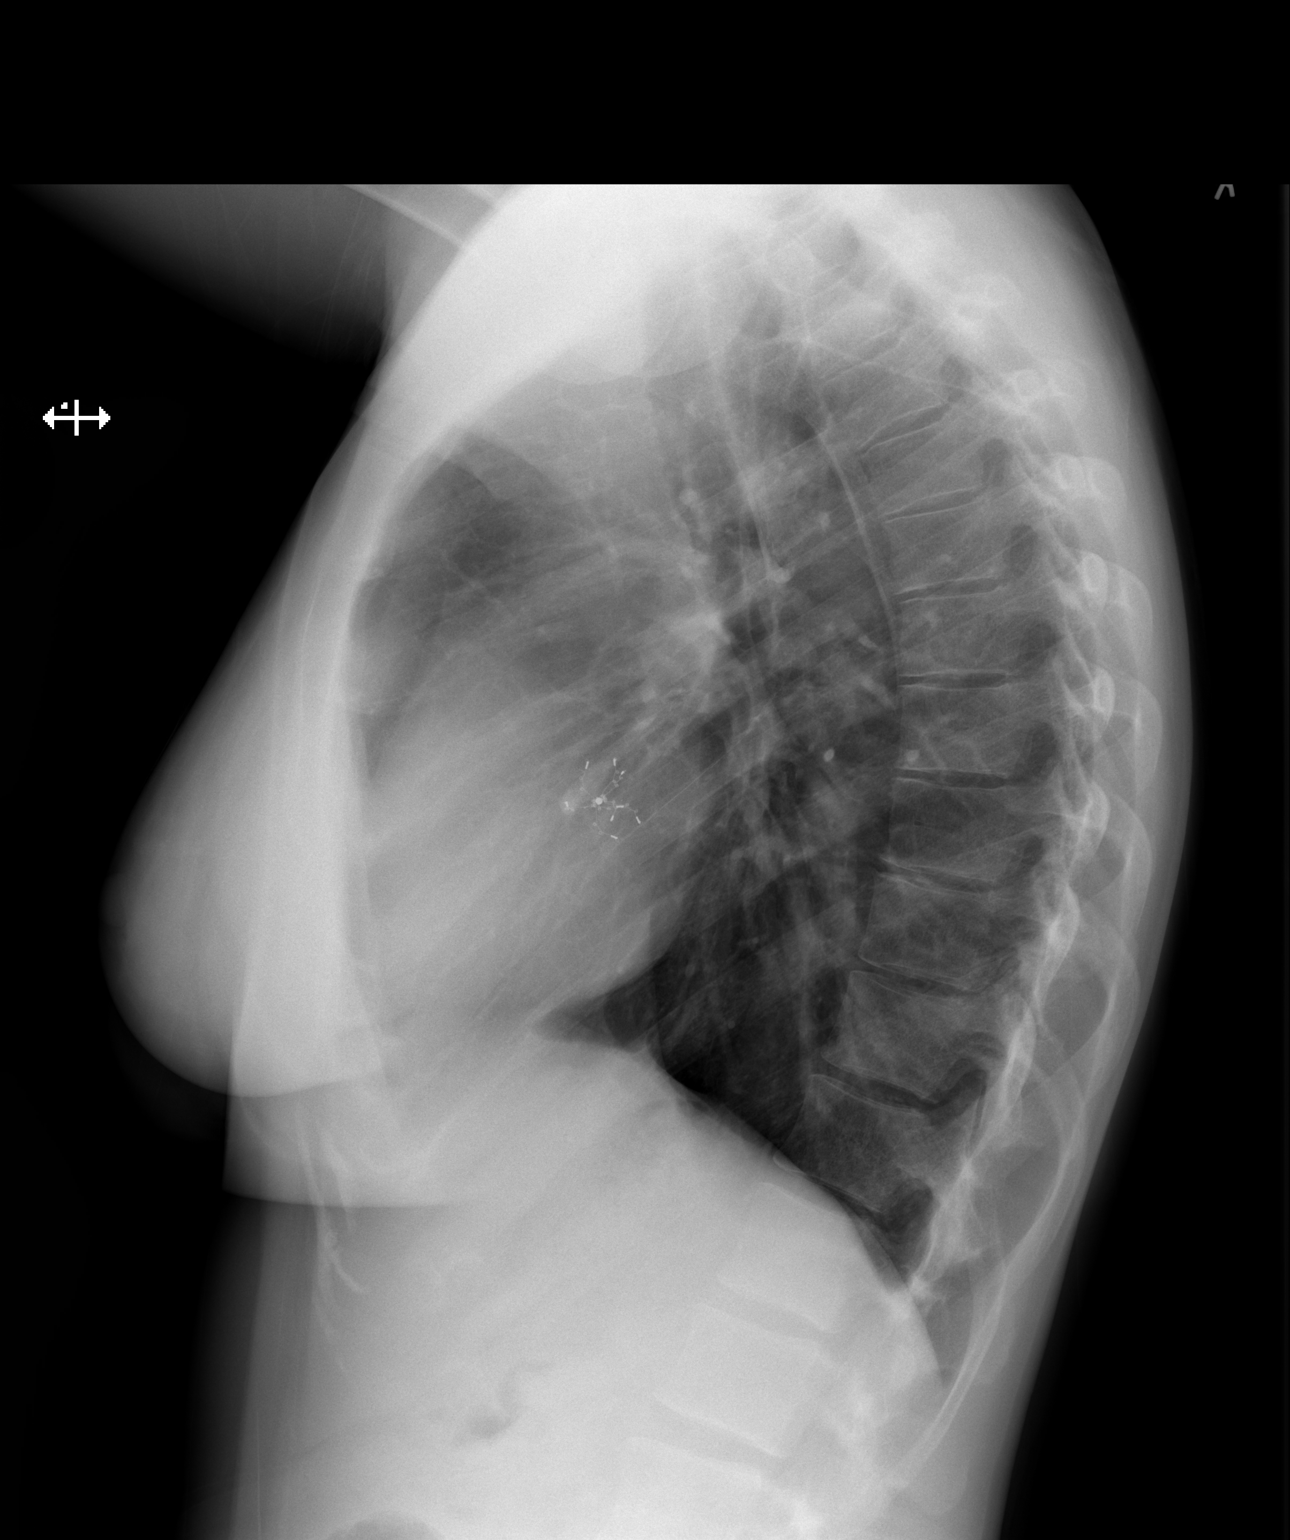

[2 of 2 positions shown; findings below may reference images not displayed]

FINDINGS: The heart size and mediastinal contours are within normal limits.
Both lungs are clear. The visualized skeletal structures are
unremarkable. Vascular closure device over the right cardiac
silhouette.
IMPRESSION: No active cardiopulmonary disease.

## 2020-09-03 ENCOUNTER — Ambulatory Visit (INDEPENDENT_AMBULATORY_CARE_PROVIDER_SITE_OTHER): Payer: Managed Care, Other (non HMO) | Admitting: *Deleted

## 2020-09-03 DIAGNOSIS — J309 Allergic rhinitis, unspecified: Secondary | ICD-10-CM

## 2020-09-09 ENCOUNTER — Ambulatory Visit (INDEPENDENT_AMBULATORY_CARE_PROVIDER_SITE_OTHER): Payer: Managed Care, Other (non HMO)

## 2020-09-09 DIAGNOSIS — J309 Allergic rhinitis, unspecified: Secondary | ICD-10-CM | POA: Diagnosis not present

## 2020-09-15 ENCOUNTER — Telehealth: Payer: Self-pay | Admitting: Allergy

## 2020-09-15 NOTE — Telephone Encounter (Signed)
Patient informed. 

## 2020-09-15 NOTE — Telephone Encounter (Signed)
Left message to call the office.

## 2020-09-15 NOTE — Telephone Encounter (Signed)
Talked with patient.  She has been having symptoms since exposure to attic.  This happened the same way earlier this summer when she went in attic.  She has been having trouble with chest pressure, trouble breathing, and almost used Epipen last night because it felt like her throat was swelling up.  She took three Benadryl and felt better.  She feels better when she goes outside.  She is using her Azelastine, Allegra 180- two daily, Benadryl, and has albuterol as needed. She did not start the Singulair due to potential side effects.  She has not been using the Flonase.  Please advise.  Also, patient wanted to know if you have any recommendations about who she could hire to test quality of air inside her house.

## 2020-09-15 NOTE — Telephone Encounter (Signed)
Patient called and states she is having a really bad allergy and asthma episode. Patient states it is because she went in her attic Monday night, but she had on a N95 mask. Patient states the next morning she was gasping for air and it has been like that since. Patient states when she goes outside she feels better, so she thinks that dust particles may have spread throughout her house. Patient has some questions for the nurses and would like to know if we have a recommendation on someone she can call regarding air quality within her house. Patient has taken Benadryl, double doses of Allegra and Flonase, and her inhaler. Patient thought she was going to have to use her epi pen last night.  Please advise.

## 2020-09-15 NOTE — Telephone Encounter (Signed)
Please call patient.  Have her use albuterol 2 puffs twice a day and may use every 4 hours if needed for the next few days.  If still having issues with her breathing, we can send in a steroid inhaler to use for a few weeks - let us know if she wants to try it and which pharmacy to send it into.   Rather than spending money on checking air quality in the house, recommend that she gets her vents cleaned. I don't have a specific company - check online and use one that is reputable with good reviews.

## 2020-09-21 ENCOUNTER — Ambulatory Visit (INDEPENDENT_AMBULATORY_CARE_PROVIDER_SITE_OTHER): Payer: Managed Care, Other (non HMO)

## 2020-09-21 DIAGNOSIS — J309 Allergic rhinitis, unspecified: Secondary | ICD-10-CM

## 2020-09-30 ENCOUNTER — Ambulatory Visit (INDEPENDENT_AMBULATORY_CARE_PROVIDER_SITE_OTHER): Payer: Managed Care, Other (non HMO)

## 2020-09-30 DIAGNOSIS — J309 Allergic rhinitis, unspecified: Secondary | ICD-10-CM | POA: Diagnosis not present

## 2020-10-12 ENCOUNTER — Ambulatory Visit (INDEPENDENT_AMBULATORY_CARE_PROVIDER_SITE_OTHER): Payer: Managed Care, Other (non HMO) | Admitting: *Deleted

## 2020-10-12 DIAGNOSIS — J309 Allergic rhinitis, unspecified: Secondary | ICD-10-CM

## 2020-10-19 ENCOUNTER — Ambulatory Visit (INDEPENDENT_AMBULATORY_CARE_PROVIDER_SITE_OTHER): Payer: Managed Care, Other (non HMO)

## 2020-10-19 DIAGNOSIS — J309 Allergic rhinitis, unspecified: Secondary | ICD-10-CM

## 2020-11-01 ENCOUNTER — Ambulatory Visit (INDEPENDENT_AMBULATORY_CARE_PROVIDER_SITE_OTHER): Payer: Managed Care, Other (non HMO)

## 2020-11-01 DIAGNOSIS — J309 Allergic rhinitis, unspecified: Secondary | ICD-10-CM

## 2020-11-15 ENCOUNTER — Ambulatory Visit (INDEPENDENT_AMBULATORY_CARE_PROVIDER_SITE_OTHER): Payer: Managed Care, Other (non HMO) | Admitting: *Deleted

## 2020-11-15 DIAGNOSIS — J309 Allergic rhinitis, unspecified: Secondary | ICD-10-CM

## 2020-11-24 ENCOUNTER — Ambulatory Visit (INDEPENDENT_AMBULATORY_CARE_PROVIDER_SITE_OTHER): Payer: Managed Care, Other (non HMO)

## 2020-11-24 DIAGNOSIS — J309 Allergic rhinitis, unspecified: Secondary | ICD-10-CM | POA: Diagnosis not present

## 2020-12-07 ENCOUNTER — Ambulatory Visit (INDEPENDENT_AMBULATORY_CARE_PROVIDER_SITE_OTHER): Payer: Managed Care, Other (non HMO)

## 2020-12-07 DIAGNOSIS — J309 Allergic rhinitis, unspecified: Secondary | ICD-10-CM

## 2020-12-16 ENCOUNTER — Ambulatory Visit (INDEPENDENT_AMBULATORY_CARE_PROVIDER_SITE_OTHER): Payer: Managed Care, Other (non HMO) | Admitting: *Deleted

## 2020-12-16 DIAGNOSIS — J309 Allergic rhinitis, unspecified: Secondary | ICD-10-CM | POA: Diagnosis not present

## 2020-12-28 ENCOUNTER — Ambulatory Visit (INDEPENDENT_AMBULATORY_CARE_PROVIDER_SITE_OTHER): Payer: Managed Care, Other (non HMO)

## 2020-12-28 DIAGNOSIS — J309 Allergic rhinitis, unspecified: Secondary | ICD-10-CM | POA: Diagnosis not present

## 2021-01-07 ENCOUNTER — Ambulatory Visit (INDEPENDENT_AMBULATORY_CARE_PROVIDER_SITE_OTHER): Payer: Managed Care, Other (non HMO)

## 2021-01-07 DIAGNOSIS — J309 Allergic rhinitis, unspecified: Secondary | ICD-10-CM

## 2021-01-17 ENCOUNTER — Ambulatory Visit (INDEPENDENT_AMBULATORY_CARE_PROVIDER_SITE_OTHER): Payer: Managed Care, Other (non HMO) | Admitting: *Deleted

## 2021-01-17 DIAGNOSIS — J309 Allergic rhinitis, unspecified: Secondary | ICD-10-CM

## 2021-01-26 ENCOUNTER — Ambulatory Visit (INDEPENDENT_AMBULATORY_CARE_PROVIDER_SITE_OTHER): Payer: Managed Care, Other (non HMO)

## 2021-01-26 DIAGNOSIS — J309 Allergic rhinitis, unspecified: Secondary | ICD-10-CM | POA: Diagnosis not present

## 2021-02-07 ENCOUNTER — Ambulatory Visit (INDEPENDENT_AMBULATORY_CARE_PROVIDER_SITE_OTHER): Payer: Managed Care, Other (non HMO) | Admitting: *Deleted

## 2021-02-07 DIAGNOSIS — J309 Allergic rhinitis, unspecified: Secondary | ICD-10-CM | POA: Diagnosis not present

## 2021-02-17 ENCOUNTER — Ambulatory Visit (INDEPENDENT_AMBULATORY_CARE_PROVIDER_SITE_OTHER): Payer: Managed Care, Other (non HMO) | Admitting: *Deleted

## 2021-02-17 DIAGNOSIS — J309 Allergic rhinitis, unspecified: Secondary | ICD-10-CM

## 2021-02-28 ENCOUNTER — Ambulatory Visit (INDEPENDENT_AMBULATORY_CARE_PROVIDER_SITE_OTHER): Payer: Managed Care, Other (non HMO)

## 2021-02-28 DIAGNOSIS — J309 Allergic rhinitis, unspecified: Secondary | ICD-10-CM | POA: Diagnosis not present

## 2021-03-07 ENCOUNTER — Ambulatory Visit (INDEPENDENT_AMBULATORY_CARE_PROVIDER_SITE_OTHER): Payer: Managed Care, Other (non HMO)

## 2021-03-07 DIAGNOSIS — J309 Allergic rhinitis, unspecified: Secondary | ICD-10-CM | POA: Diagnosis not present

## 2021-03-17 ENCOUNTER — Ambulatory Visit (INDEPENDENT_AMBULATORY_CARE_PROVIDER_SITE_OTHER): Payer: Managed Care, Other (non HMO) | Admitting: *Deleted

## 2021-03-17 DIAGNOSIS — J309 Allergic rhinitis, unspecified: Secondary | ICD-10-CM | POA: Diagnosis not present

## 2021-03-24 ENCOUNTER — Ambulatory Visit (INDEPENDENT_AMBULATORY_CARE_PROVIDER_SITE_OTHER): Payer: Managed Care, Other (non HMO) | Admitting: *Deleted

## 2021-03-24 DIAGNOSIS — J309 Allergic rhinitis, unspecified: Secondary | ICD-10-CM | POA: Diagnosis not present

## 2021-04-05 ENCOUNTER — Ambulatory Visit (INDEPENDENT_AMBULATORY_CARE_PROVIDER_SITE_OTHER): Payer: Managed Care, Other (non HMO) | Admitting: *Deleted

## 2021-04-05 DIAGNOSIS — J309 Allergic rhinitis, unspecified: Secondary | ICD-10-CM | POA: Diagnosis not present

## 2021-04-18 ENCOUNTER — Ambulatory Visit (INDEPENDENT_AMBULATORY_CARE_PROVIDER_SITE_OTHER): Payer: Managed Care, Other (non HMO) | Admitting: *Deleted

## 2021-04-18 DIAGNOSIS — J309 Allergic rhinitis, unspecified: Secondary | ICD-10-CM | POA: Diagnosis not present

## 2021-05-03 ENCOUNTER — Ambulatory Visit (INDEPENDENT_AMBULATORY_CARE_PROVIDER_SITE_OTHER): Payer: Managed Care, Other (non HMO) | Admitting: *Deleted

## 2021-05-03 DIAGNOSIS — J309 Allergic rhinitis, unspecified: Secondary | ICD-10-CM

## 2021-05-11 ENCOUNTER — Ambulatory Visit (INDEPENDENT_AMBULATORY_CARE_PROVIDER_SITE_OTHER): Payer: Managed Care, Other (non HMO)

## 2021-05-11 DIAGNOSIS — J309 Allergic rhinitis, unspecified: Secondary | ICD-10-CM | POA: Diagnosis not present

## 2021-05-12 ENCOUNTER — Telehealth: Payer: Self-pay | Admitting: *Deleted

## 2021-05-12 NOTE — Telephone Encounter (Signed)
Patient is due for office visit.  Please have her schedule a follow up before her next injection.  Thank you.

## 2021-05-12 NOTE — Telephone Encounter (Signed)
Patient came in today follow a local reaction that she had to her allergy injections yesterday. Patient had a 5+ reaction to her injections in both arms after receiving .106mL of her Green vials. She showed me the injection sites and there were a lot lower on the arm than where they are typically given. I did advise one may have gone into the muscle but could not confirm since I did not witness the injection being administered. Her left arm was a 5+ that wrapped around to underneath her arm. Her right arm was 5+ and there was edema present in her right elbow in comparison to her left elbow. Patient denies any systemic reaction systems. I advised to apply ice to the affected right elbow and continue her antihistamines until her arm returns to normal. There was no more redness or welp at the injection site. Just the edema in the right elbow.

## 2021-05-12 NOTE — Telephone Encounter (Signed)
Pt is scheduled for Monday at 330 with chrissie in [employer]

## 2021-05-15 NOTE — Patient Instructions (Addendum)
  Seasonal and perennial allergic rhinitis -Continue avoidance measures directed towards grass pollen, weed pollen, ragweed, tree, mold, dust mite and dog. -Continue Allegra 1 tablet once a day as needed for runny nose/itching.  May take 1 tablet twice a day if needed.  As this can make you drowsy. -Continue Flonase (fluticasone) nasal spray 1 spray per nostril twice a day as needed for nasal congestion. In the right nostril, point the applicator out toward the right ear. In the left nostril, point the applicator out toward the left ear -Continue azelastine nasal spray 1-2 sprays per nostril twice a day as needed for runny nose/drainage -Continue allergy injections, but: decrease next dose to 0.40 of 1:1000 (green) and switch to schedule A  Mild intermittent reactive airway disease -May use albuterol rescue inhaler 2 puffs every 4 to 6 hours as needed for shortness of breath, chest tightness, coughing, and wheezing. May use albuterol rescue inhaler 2 puffs 5 to 15 minutes prior to strenuous physical activities. Monitor frequency of use.  -During flareups and now start Alvesco 80 mcg 1 puff twice a day with spacer for 1 to 2 weeks and stop   Adverse food reaction -May try seafood/shellfish at home.  Follow up in 2 months or sooner if needed.

## 2021-05-16 ENCOUNTER — Ambulatory Visit: Payer: Managed Care, Other (non HMO) | Admitting: Family

## 2021-05-16 ENCOUNTER — Other Ambulatory Visit: Payer: Self-pay

## 2021-05-16 ENCOUNTER — Encounter: Payer: Self-pay | Admitting: Family

## 2021-05-16 VITALS — BP 118/76 | HR 88 | Temp 97.8°F | Resp 16 | Ht 66.0 in | Wt 129.0 lb

## 2021-05-16 DIAGNOSIS — J4520 Mild intermittent asthma, uncomplicated: Secondary | ICD-10-CM | POA: Diagnosis not present

## 2021-05-16 DIAGNOSIS — T781XXD Other adverse food reactions, not elsewhere classified, subsequent encounter: Secondary | ICD-10-CM

## 2021-05-16 DIAGNOSIS — J3089 Other allergic rhinitis: Secondary | ICD-10-CM

## 2021-05-16 MED ORDER — FLUTICASONE PROPIONATE 50 MCG/ACT NA SUSP: NASAL | 5 refills | Status: AC

## 2021-05-16 MED ORDER — ALBUTEROL SULFATE HFA 108 (90 BASE) MCG/ACT IN AERS: INHALATION_SPRAY | RESPIRATORY_TRACT | 1 refills | Status: DC

## 2021-05-16 MED ORDER — SPACER/AERO-HOLDING CHAMBERS DEVI: 1.0000 | Freq: Once | 0 refills | Status: AC

## 2021-05-16 MED ORDER — AZELASTINE HCL 0.15 % NA SOLN: NASAL | 5 refills | Status: AC

## 2021-05-16 NOTE — Progress Notes (Signed)
[address] Debbora Presto [place] [address] Dept: [phone]  FOLLOW UP NOTE  Patient ID: [name], female    DOB: 07-21-1986  Age: 35 y.o. MRN: [id number] Date of Office Visit: 05/16/2021  Assessment  Chief Complaint: Allergic Rhinitis , Other (Patient declined weight on scale today ), Allergic Reaction (Had a severe reaction right arm was swollen due to allergy injection. Lasted from Wednesday all the way till Saturday. Saturday morning she has been moving and wears a mask to protect herself from dust. Not sure if the reaction was due to the shot or her moving/ environmental allergies. ), and Asthma (ACT - 12 has had issues with SOB she used her daughter's neb machine and it helped she is interested in getting neb solution if possible)  HPI [name] is a 35 year old female who presents today for an acute visit.  She was last seen on July 28, 2020 by Dr. Selena Batten for allergic rhinitis, mild intermittent reactive airway disease, and adverse food reaction.  Allergic rhinitis is reported as not well controlled with Allegra 2 tablets once a day, Flonase on most days, azelastine nasal spray as needed, and sinus rinse twice a week.  She did not start Singulair 10 mg once a day due to the side effects.  She also continues to build up on her allergy injections.  She reports that at her last injection she had swelling in her right arm below her elbow and that this was very alarming.  It stayed swollen for 3 and half days and was double the size of her arm.  There was fluid in it it and the area would get red. She wonders if the injection was given in muscle.    Mild intermittent reactive airway disease is reported as not well controlled.This Friday she was packing because they are moving to [place], [place] and wore a N95 mask.  Saturday she woke up gasping for air with no cold symptoms and could not talk yesterday.  She felt tired and sluggish.  She used her albuterol inhaler and  this helped for a couple of hours.  She reports that this is how she usually feels when she has an allergic reaction to environmental allergies.  She reports that she is now coughing due to drainage.  She has some wheezing, a rattling sound in her chest, and on Saturday she felt tight in her chest.  Shortness of breath is described as not as much today.  He denies any fevers or chills during this time she has been using her albuterol 3-4 times a day.  Generally she does not have to use her albuterol inhaler.  She continues to avoid seafood and shellfish.  She did not ever try these foods at home.  She prefers to just avoid these foods.    Drug Allergies:  No Known Allergies  Review of Systems: Review of Systems  Constitutional:  Negative for chills and fever.  HENT:         Reports postnasal drip and denies rhinorrhea and nasal congestion  Eyes:        Denies itchy watery eyes  Respiratory:  Positive for cough, shortness of breath and wheezing.        Reports cough that is dry and sometimes productive with clear sputum.  Also reports wheezing and shortness of breath that has gotten some better  Cardiovascular:  Negative for chest pain and palpitations.  Gastrointestinal:  Positive for heartburn.  Reports a little bit of heartburn recently  Genitourinary:  Negative for dysuria.  Skin:  Negative for itching and rash.  Neurological:  Negative for headaches.  Endo/Heme/Allergies:  Positive for environmental allergies.    Physical Exam: BP 118/76   Pulse 88   Temp 97.8 F (36.6 C)   Resp 16   Ht 5\' 6"  (1.676 m)   Wt 129 lb (58.5 kg)   SpO2 97%   BMI 20.82 kg/m    Physical Exam Constitutional:      Appearance: Normal appearance.  HENT:     Head: Normocephalic and atraumatic.     Comments: Pharynx normal, eyes normal, ears: Unable to see bilateral tympanic membranes due to cerumen.  Nose: Bilateral lower turbinate moderately edematous and slightly erythematous with clear  drainage noted.  Left turbinate greater than right turbinate    Right Ear: Ear canal and external ear normal.     Left Ear: Ear canal and external ear normal.     Mouth/Throat:     Mouth: Mucous membranes are moist.     Pharynx: Oropharynx is clear.  Eyes:     Conjunctiva/sclera: Conjunctivae normal.  Cardiovascular:     Rate and Rhythm: Normal rate and regular rhythm.     Heart sounds: Normal heart sounds.  Pulmonary:     Effort: Pulmonary effort is normal.     Breath sounds: Normal breath sounds.     Comments: Lungs clear to auscultation Musculoskeletal:     Cervical back: Neck supple.  Skin:    General: Skin is warm.     Comments: No erythema or edema noted in right arm  Neurological:     Mental Status: She is alert and oriented to person, place, and time.  Psychiatric:        Mood and Affect: Mood normal.        Behavior: Behavior normal.        Thought Content: Thought content normal.        Judgment: Judgment normal.    Diagnostics: FVC 3.74 L, FEV1 3.14 L.  Predicted FVC 4.00 L, predicted FEV1 3.30 L.  Spirometry indicates normal ventilatory function.  Post bronchodilator response shows FVC 3.21 L, FEV1 2.73 L.  Spirometry indicates normal ventilatory function with no bronchodilator response  Assessment and Plan: 1. Mild intermittent reactive airway disease   2. Other allergic rhinitis   3. Adverse food reaction, subsequent encounter     Meds ordered this encounter  Medications   Spacer/Aero-Holding DEVI    Sig: 1 each by Does not apply route once for 1 dose.    Dispense:  1 each    Refill:  0   Azelastine HCl 0.15 % SOLN    Sig: Apply 1-2 sprays per nostril twice a day as needed for runny nose/drainage    Dispense:  30 mL    Refill:  5   fluticasone (FLONASE) 50 MCG/ACT nasal spray    Sig: Apply 1 spray per nostril twice a day as needed for nasal congestion.    Dispense:  16 g    Refill:  5   albuterol (VENTOLIN HFA) 108 (90 Base) MCG/ACT inhaler     Sig: Inhale 2 puffs every 4 to 6 hours as needed for shortness of breath, chest tightness, coughing, and wheezing.    Dispense:  18 g    Refill:  1     Patient Instructions   Seasonal and perennial allergic rhinitis -Continue avoidance measures directed towards grass pollen,  weed pollen, ragweed, tree, mold, dust mite and dog. -Continue Allegra 1 tablet once a day as needed for runny nose/itching.  May take 1 tablet twice a day if needed.  As this can make you drowsy. -Continue Flonase (fluticasone) nasal spray 1 spray per nostril twice a day as needed for nasal congestion. In the right nostril, point the applicator out toward the right ear. In the left nostril, point the applicator out toward the left ear -Continue azelastine nasal spray 1-2 sprays per nostril twice a day as needed for runny nose/drainage -Continue allergy injections, but: decrease next dose to 0.40 of 1:1000 (green) and switch to schedule A  Mild intermittent reactive airway disease -May use albuterol rescue inhaler 2 puffs every 4 to 6 hours as needed for shortness of breath, chest tightness, coughing, and wheezing. May use albuterol rescue inhaler 2 puffs 5 to 15 minutes prior to strenuous physical activities. Monitor frequency of use.  -During flareups and now start Alvesco 80 mcg 1 puff twice a day with spacer for 1 to 2 weeks and stop   Adverse food reaction -May try seafood/shellfish at home.  Follow up in 2 months or sooner if needed.    Return in about 2 months (around 07/16/2021), or if symptoms worsen or fail to improve.    Thank you for the opportunity to care for this patient.  Please do not hesitate to contact me with questions.  Nehemiah Settle, FNP [facility]

## 2021-05-17 ENCOUNTER — Other Ambulatory Visit: Payer: Self-pay | Admitting: *Deleted

## 2021-05-17 MED ORDER — ALBUTEROL SULFATE HFA 108 (90 BASE) MCG/ACT IN AERS: INHALATION_SPRAY | RESPIRATORY_TRACT | 1 refills | Status: DC

## 2021-05-18 ENCOUNTER — Other Ambulatory Visit: Payer: Self-pay

## 2021-05-18 MED ORDER — ALBUTEROL SULFATE HFA 108 (90 BASE) MCG/ACT IN AERS: INHALATION_SPRAY | RESPIRATORY_TRACT | 1 refills | Status: DC

## 2021-05-19 ENCOUNTER — Other Ambulatory Visit: Payer: Self-pay

## 2021-05-19 ENCOUNTER — Telehealth: Payer: Self-pay | Admitting: Family

## 2021-05-19 MED ORDER — PREDNISONE 10 MG PO TABS: ORAL_TABLET | ORAL | 0 refills | Status: AC

## 2021-05-19 MED ORDER — ALVESCO 80 MCG/ACT IN AERS: 1.0000 | INHALATION_SPRAY | Freq: Two times a day (BID) | RESPIRATORY_TRACT | 5 refills | Status: AC

## 2021-05-19 NOTE — Telephone Encounter (Signed)
Please send in prednisone 10 mg-taking 2 tablets twice a day for 3 days, then, on the 4th day take 2 tablets in the morning, and on the 5th day take 1 tablet and stop. Quantity: 15 tablets with no refills. Please send prescription to [facility] [address]  Please tell her to let us know if she is not getting any better

## 2021-05-19 NOTE — Telephone Encounter (Signed)
Sent in [facility] tried calling pt to inform her of doing so but voicemail box is full

## 2021-05-19 NOTE — Telephone Encounter (Signed)
Pt has lost voice, chest tightness, and can not breath that well laying down. Post nasal drip, yellow coming out of the nose. No fever but she is achy had the same thing last year and it was a bad sinus infection.She is doing ansal saline rinse. She is doing nasal sprays and has the albuterol and samples of alvesco. I have sent in the alvesco rx to [facility] as that's what insurance would not cover. Please Can we send in anything to help her?

## 2021-05-19 NOTE — Addendum Note (Signed)
Addended by: Berna Bue on: 05/19/2021 04:17 PM   Modules accepted: Orders

## 2021-05-19 NOTE — Telephone Encounter (Signed)
Pt informed of sending in rx

## 2021-05-19 NOTE — Telephone Encounter (Signed)
Pt states the insurance won't cover any of the medication sent over and she has been fighting with them about it the last three days. Pt states she can barely talk and needs something to help.

## 2021-05-19 NOTE — Telephone Encounter (Signed)
Please have her get tested for Covid-19 due to the body aches and call us with the results and we will go from there.  So is alvesco approved?

## 2021-05-19 NOTE — Telephone Encounter (Signed)
Pt refuses to get tested as she said she had these symptoms last year when she was sick around this time. She said this is a upper respiratory infection like last time. She was given a antibiotic and prednisone last time to clear it up. She would like a refund from her last visit as she stated this was ridicules to suggest. She stated she does not have covid. She knows her body and knows this is just like the upper respiratory infection like last time.

## 2021-05-19 NOTE — Telephone Encounter (Signed)
Please let [name] know that the reason a COVID-19 test was suggested was because body aches are not a normal symptom of sinus infections. I would like her to get tested for COVID-19 because the treatments for a sinus infection and COVID-19 are different.

## 2021-05-20 ENCOUNTER — Other Ambulatory Visit: Payer: Self-pay

## 2021-05-20 MED ORDER — ALBUTEROL SULFATE HFA 108 (90 BASE) MCG/ACT IN AERS: INHALATION_SPRAY | RESPIRATORY_TRACT | 1 refills | Status: AC

## 2021-06-14 DIAGNOSIS — J3089 Other allergic rhinitis: Secondary | ICD-10-CM | POA: Diagnosis not present

## 2021-06-14 NOTE — Progress Notes (Signed)
VIAL MADE. EXP 06-14-22 

## 2021-07-08 ENCOUNTER — Other Ambulatory Visit: Payer: Self-pay

## 2021-07-08 MED ORDER — ALBUTEROL SULFATE HFA 108 (90 BASE) MCG/ACT IN AERS: 2.0000 | INHALATION_SPRAY | RESPIRATORY_TRACT | 3 refills | Status: AC | PRN

## 2021-10-25 ENCOUNTER — Ambulatory Visit: Admit: 2021-10-25 | Discharge: 2021-10-25 | Payer: PRIVATE HEALTH INSURANCE | Attending: Family Medicine

## 2021-10-25 DIAGNOSIS — J45901 Unspecified asthma with (acute) exacerbation: Secondary | ICD-10-CM

## 2021-10-25 MED ORDER — METHYLPREDNISOLONE 4 MG PO TBPK
4 MG | PACK | ORAL | 0 refills | Status: AC
Start: 2021-10-25 — End: 2022-03-13

## 2021-10-25 NOTE — Progress Notes (Signed)
Meagan Trevino    Source: Patient appears reliable    Add'l history from: Epic records    Time Patient seen by Provider:3:09 PM     Chief complaint:    Chief Complaint   Patient presents with    URI     Pt was cleaning around dust last week which she is allergic to       HISTORY:    Meagan Trevino is a 35 y.o. female who presents for evaluation of   Chief Complaint   Patient presents with    URI     Pt was cleaning around dust last week which she is allergic to     35 year old female with past medical history as noted including history of allergic rhinitis and allergic bronchitis now presents with one-week history of cough, intermittent laryngitis after being exposed to significant amount of dust after disassembling/going through a number of boxes from a move from [facility] 4 months ago. Denies fever, ear pain, sore throat. Patient was receiving immunotherapy with allergy injections but has not done this since moving here. She does not have a PCP or an allergist involved in her care  She has noted some improvement with albuterol intermittently        Patient's Social History: Reviewed and confirmed in epic as below:    Social History     Socioeconomic History    Marital status: Single     Spouse name: None    Number of children: None    Years of education: None    Highest education level: None   Tobacco Use    Smoking status: Never    Smokeless tobacco: Never   Substance and Sexual Activity    Alcohol use: Yes    Drug use: Never       Patient's Allergies Reviewed and confirmed as listed below    Allergies   Allergen Reactions    Molds & Smuts        Current Medications reviewed and confirmed:    Outpatient Medications Marked as Taking for the 10/25/21 encounter (Office Visit) with Carolina Sink, MD   Medication Sig Dispense Refill    amphetamine-dextroamphetamine (ADDERALL) 10 MG tablet       clonazePAM (KLONOPIN) 1 MG tablet       sertraline (ZOLOFT) 50 MG tablet       methylPREDNISolone (MEDROL  DOSEPACK) 4 MG tablet Administer methylprednisolone Dosepak (4 mg tabs) over 6 days.  See admin instructions. 1 kit 0       Nursing Notes and Vital Signs reviewed    EXAM:    VITALS: BP 107/73    Pulse (!) 107    Temp 98 ??F (36.7 ??C)    Ht '5\' 6"'  (1.676 m)    Wt 130 lb (59 kg)    SpO2 99%    BMI 20.98 kg/m??     GENERAL: alert, well appearing, well nourished, no distress    HEAD: Normocephalic, no masses, lesions, or other abnormalities.    EYE EXAM: normal conjunctiva    EARS: External ears normal, hearing grossly intact.    NOSE: no deformity.    OROPHARYNX: lips and tongue normal. No erythema    HEART: regular rate and rhythm    LUNGS: Normal respiratory rate and normal respiratory effort. Good air movement. Bilaterally clear        DIAGNOSIS:    Diagnosis Orders   1. Bronchitis, allergic, unspecified asthma severity, with acute exacerbation  methylPREDNISolone (MEDROL  DOSEPACK) 4 MG tablet          PLAN:  1. Bronchitis, allergic, unspecified asthma severity, with acute exacerbation  -     methylPREDNISolone (MEDROL DOSEPACK) 4 MG tablet; Administer methylprednisolone Dosepak (4 mg tabs) over 6 days.  See admin instructions., Disp-1 kit, R-0Normal      No results found for any visits on 10/25/21.    Current Outpatient Medications   Medication Sig Dispense Refill    amphetamine-dextroamphetamine (ADDERALL) 10 MG tablet       clonazePAM (KLONOPIN) 1 MG tablet       sertraline (ZOLOFT) 50 MG tablet       methylPREDNISolone (MEDROL DOSEPACK) 4 MG tablet Administer methylprednisolone Dosepak (4 mg tabs) over 6 days.  See admin instructions. 1 kit 0    clonazePAM (KLONOPIN) 0.5 MG tablet  (Patient not taking: Reported on 10/25/2021)      NIKKI 3-0.02 MG per tablet  (Patient not taking: Reported on 10/25/2021)      hydrOXYzine HCl (ATARAX) 10 MG tablet  (Patient not taking: Reported on 10/25/2021)      lamoTRIgine (LAMICTAL) 200 MG tablet  (Patient not taking: Reported on 10/25/2021)       No current facility-administered  medications for this visit.        Orders Placed This Encounter   Medications    methylPREDNISolone (MEDROL DOSEPACK) 4 MG tablet     Sig: Administer methylprednisolone Dosepak (4 mg tabs) over 6 days.  See admin instructions.     Dispense:  1 kit     Refill:  0     Allergen avoidance  HEPA filters  Patient given information to schedule follow-up with [facility] or [place] [facility]    Follow-up if no gradual resolution of symptoms or any new or worsening symptoms develop in the interim  Carolina Sink, MD

## 2022-03-13 ENCOUNTER — Ambulatory Visit: Admit: 2022-03-13 | Discharge: 2022-03-13 | Payer: PRIVATE HEALTH INSURANCE | Attending: Family Medicine

## 2022-03-13 DIAGNOSIS — J4521 Mild intermittent asthma with (acute) exacerbation: Secondary | ICD-10-CM

## 2022-03-13 MED ORDER — METHYLPREDNISOLONE 4 MG PO TBPK
4 MG | PACK | ORAL | 0 refills | Status: DC
Start: 2022-03-13 — End: 2023-01-18

## 2022-03-13 NOTE — Progress Notes (Signed)
[name]    Source: Patient appears reliable    Add'l history from: Epic records    Time Patient seen by Provider:6:04 PM     Chief complaint:    Chief Complaint   Patient presents with    Asthma     Asthma flare up; symptoms:chest pressure, back pain; started Thursday       HISTORY:    Meagan Trevino is a 36 y.o. female who presents for evaluation of   Chief Complaint   Patient presents with    Asthma     Asthma flare up; symptoms:chest pressure, back pain; started Thursday     36 year old female with past medical history as noted now presents with complaint of 3 to 4-day history of intermittent shortness of breath, chest pressure, cough consistent with previous episodes of asthma flares.  Patient states that she frequently develops these symptoms during this time of year.  Denies fever, significant cough, nasal congestion, sore throat, known exposure to specific illness, leg pain or swelling        Patient's Social History: Reviewed and confirmed in epic as below:    Social History     Socioeconomic History    Marital status: Single     Spouse name: None    Number of children: None    Years of education: None    Highest education level: None   Tobacco Use    Smoking status: Never    Smokeless tobacco: Never   Substance and Sexual Activity    Alcohol use: Yes    Drug use: Never       Patient's Allergies Reviewed and confirmed as listed below    Allergies   Allergen Reactions    Molds & Smuts        Current Medications reviewed and confirmed:    Outpatient Medications Marked as Taking for the 03/13/22 encounter (Office Visit) with Carolina Sink, MD   Medication Sig Dispense Refill    albuterol sulfate HFA (PROVENTIL;VENTOLIN;PROAIR) 108 (90 Base) MCG/ACT inhaler Inhale 2 puffs into the lungs every 4 hours as needed      methylPREDNISolone (MEDROL DOSEPACK) 4 MG tablet Administer methylprednisolone Dosepak (4 mg tabs) over 6 days.  See admin instructions. 1 kit 0    amphetamine-dextroamphetamine (ADDERALL) 10 MG  tablet          Nursing Notes and Vital Signs reviewed    EXAM:    VITALS: BP 109/74   Pulse 97   Temp 99.4 F (37.4 C)   Ht '5\' 6"'  (1.676 m)   Wt 128 lb (58.1 kg)   SpO2 99%   BMI 20.66 kg/m     GENERAL: alert, well appearing, well nourished, no distress    HEAD: Normocephalic, no masses, lesions, or other abnormalities.    EYE EXAM: normal conjunctiva    EARS: External ears normal, hearing grossly intact.    NOSE: no deformity.    OROPHARYNX: lips and tongue normal. No erythema    HEART: regular rate and rhythm    LUNGS: Normal respiratory rate and normal respiratory effort. Good air movement. Bilaterally clear      DIAGNOSIS:    Diagnosis Orders   1. Mild intermittent asthma with acute exacerbation  methylPREDNISolone (MEDROL DOSEPACK) 4 MG tablet          PLAN:  1. Mild intermittent asthma with acute exacerbation  -     methylPREDNISolone (MEDROL DOSEPACK) 4 MG tablet; Administer methylprednisolone Dosepak (4 mg tabs) over 6 days.  See admin instructions., Disp-1 kit, R-0Normal      No results found for any visits on 03/13/22.    Current Outpatient Medications   Medication Sig Dispense Refill    albuterol sulfate HFA (PROVENTIL;VENTOLIN;PROAIR) 108 (90 Base) MCG/ACT inhaler Inhale 2 puffs into the lungs every 4 hours as needed      methylPREDNISolone (MEDROL DOSEPACK) 4 MG tablet Administer methylprednisolone Dosepak (4 mg tabs) over 6 days.  See admin instructions. 1 kit 0    amphetamine-dextroamphetamine (ADDERALL) 10 MG tablet       lamoTRIgine (LAMICTAL) 100 MG tablet  (Patient not taking: Reported on 03/13/2022)      clonazePAM (KLONOPIN) 0.5 MG tablet  (Patient not taking: Reported on 10/25/2021)      clonazePAM (KLONOPIN) 1 MG tablet  (Patient not taking: Reported on 03/13/2022)      NIKKI 3-0.02 MG per tablet  (Patient not taking: Reported on 10/25/2021)      hydrOXYzine HCl (ATARAX) 10 MG tablet  (Patient not taking: Reported on 10/25/2021)      lamoTRIgine (LAMICTAL) 200 MG tablet  (Patient not  taking: Reported on 10/25/2021)      sertraline (ZOLOFT) 50 MG tablet  (Patient not taking: Reported on 03/13/2022)       No current facility-administered medications for this visit.        Orders Placed This Encounter   Medications    methylPREDNISolone (MEDROL DOSEPACK) 4 MG tablet     Sig: Administer methylprednisolone Dosepak (4 mg tabs) over 6 days.  See admin instructions.     Dispense:  1 kit     Refill:  0       Cautious observation of symptoms.  Patient did do a home COVID test which was negative.  Recommend follow-up if no gradual resolution over the next several days or any worsening or new symptom development in the interim    Carolina Sink, MD

## 2022-05-04 ENCOUNTER — Ambulatory Visit: Admit: 2022-05-04 | Discharge: 2022-05-04 | Payer: PRIVATE HEALTH INSURANCE | Attending: Family

## 2022-05-04 DIAGNOSIS — J0100 Acute maxillary sinusitis, unspecified: Secondary | ICD-10-CM

## 2022-05-04 MED ORDER — AMOXICILLIN-POT CLAVULANATE 875-125 MG PO TABS
875-125 MG | ORAL_TABLET | Freq: Two times a day (BID) | ORAL | 0 refills | Status: AC
Start: 2022-05-04 — End: 2022-05-14

## 2022-05-04 NOTE — Progress Notes (Signed)
Subjective:      Patient ID: Meagan Trevino      The patient is a 36 y.o. female presents to express care for evaluation of sinus congestion and pressure that has been ongoing for about a week and a half that got significantly worse today.  She is also had a sore throat but that resolved yesterday and an intermittent dry cough.  Over the last 48 hours she is also developed left ear pain.  She denies any fevers or chills.  She has been taking over-the-counter medicine with minimal relief.    Review of Systems: Otherwise negative and as noted in the HPI    History reviewed. No pertinent past medical history.     History reviewed. No pertinent surgical history.     Allergies   Allergen Reactions    Molds & Smuts     Seasonal        Current Outpatient Medications   Medication Sig Dispense Refill    amoxicillin-clavulanate (AUGMENTIN) 875-125 MG per tablet Take 1 tablet by mouth 2 times daily for 10 days 20 tablet 0    albuterol sulfate HFA (PROVENTIL;VENTOLIN;PROAIR) 108 (90 Base) MCG/ACT inhaler Inhale 2 puffs into the lungs every 4 hours as needed      lamoTRIgine (LAMICTAL) 100 MG tablet  (Patient not taking: Reported on 03/13/2022)      methylPREDNISolone (MEDROL DOSEPACK) 4 MG tablet Administer methylprednisolone Dosepak (4 mg tabs) over 6 days.  See admin instructions. 1 kit 0    amphetamine-dextroamphetamine (ADDERALL) 10 MG tablet       clonazePAM (KLONOPIN) 0.5 MG tablet  (Patient not taking: Reported on 10/25/2021)      clonazePAM (KLONOPIN) 1 MG tablet  (Patient not taking: Reported on 03/13/2022)      NIKKI 3-0.02 MG per tablet  (Patient not taking: Reported on 10/25/2021)      hydrOXYzine HCl (ATARAX) 10 MG tablet  (Patient not taking: Reported on 10/25/2021)      lamoTRIgine (LAMICTAL) 200 MG tablet  (Patient not taking: Reported on 10/25/2021)      sertraline (ZOLOFT) 50 MG tablet  (Patient not taking: Reported on 03/13/2022)       No current facility-administered medications for this visit.        BP 106/67  (Site: Left Upper Arm, Position: Sitting)   Pulse 72   Temp 97.8 F (36.6 C) (Oral)   SpO2 99%      Objective:   Physical Exam  Constitutional:       Appearance: Normal appearance. She is normal weight.   HENT:      Head: Normocephalic and atraumatic.      Right Ear: Tympanic membrane normal. There is impacted cerumen.      Left Ear: Tympanic membrane is erythematous and bulging.      Nose: Congestion and rhinorrhea present. Rhinorrhea is purulent.      Right Turbinates: Swollen.      Left Turbinates: Swollen.      Right Sinus: Maxillary sinus tenderness present.      Left Sinus: Maxillary sinus tenderness present.      Mouth/Throat:      Mouth: Mucous membranes are moist.      Pharynx: Uvula midline. Posterior oropharyngeal erythema present. No oropharyngeal exudate.   Eyes:      Conjunctiva/sclera: Conjunctivae normal.   Cardiovascular:      Rate and Rhythm: Normal rate and regular rhythm.   Pulmonary:      Effort: Pulmonary effort is normal.  Breath sounds: Normal breath sounds.   Skin:     General: Skin is warm and dry.   Neurological:      General: No focal deficit present.      Mental Status: She is alert and oriented to person, place, and time.   Psychiatric:         Mood and Affect: Mood normal.         Behavior: Behavior normal.         Thought Content: Thought content normal.         Judgment: Judgment normal.       '@SCANIMAGES' @    Assessment:   1. Acute non-recurrent maxillary sinusitis  2. Non-recurrent acute suppurative otitis media of left ear without spontaneous rupture of tympanic membrane        Plan:     Orders Placed This Encounter   Medications    amoxicillin-clavulanate (AUGMENTIN) 875-125 MG per tablet     Sig: Take 1 tablet by mouth 2 times daily for 10 days     Dispense:  20 tablet     Refill:  0       No results found for any visits on 05/04/22.         Based on patient's history and exam, we will treat for acute sinusitis.  Antibiotic medication has been sent to the pharmacy and  patient should complete the entire course.  Discussed continued supportive care and over-the-counter medication as well as indications for follow-up including worsening symptoms or any new fevers.  Patient voiced understanding and has no questions at this time.        Louellen Molder, APRN - NP    I have reviewed prior visit notes and lab results pertinent to this visit. Point of care results and imaging were discussed with the patient. Educated the patient on disease process, expected course of illness, and management. Educated the patient on how to administer prescribed medications and possible side effects.

## 2023-01-18 ENCOUNTER — Ambulatory Visit: Admit: 2023-01-18 | Discharge: 2023-01-18 | Payer: PRIVATE HEALTH INSURANCE | Attending: Family Medicine

## 2023-01-18 DIAGNOSIS — U071 COVID-19: Secondary | ICD-10-CM

## 2023-01-18 LAB — POC COVID-19 & INFLUENZA COMBO (LIAT IN HOUSE)
INFLUENZA A: NOT DETECTED
INFLUENZA B: NOT DETECTED
SARS-CoV-2: DETECTED — AB

## 2023-01-18 MED ORDER — EPINEPHRINE 0.3 MG/0.3ML IJ SOAJ
0.3 | Freq: Once | INTRAMUSCULAR | Status: AC
Start: 2023-01-18 — End: ?

## 2023-01-18 MED ORDER — METHYLPREDNISOLONE 4 MG PO TBPK
4 MG | PACK | ORAL | 0 refills | Status: DC
Start: 2023-01-18 — End: 2023-02-28

## 2023-01-18 NOTE — Progress Notes (Signed)
Meagan Trevino    Source: Patient appears reliable    Add'l history from: Epic records    Time Patient seen by Provider:5:07 PM     Chief complaint:    Chief Complaint   Patient presents with    Other    Congestion     X 3 days and woke up with swollen face and lips       HISTORY:    Meagan Trevino is a 37 y.o. female who presents for evaluation of   Chief Complaint   Patient presents with    Other    Congestion     X 3 days and woke up with swollen face and lips     37 year old female with past medical history as noted now presents with complaint of nasal congestion, low-grade fever, facial swelling including lips, perioral area.  She notes no tongue swelling or throat swelling or difficulty breathing.  She states that she started to feel somewhat feverish today.  She does have a history of lip and facial swelling in similar circumstances.  No known exposure to specific illness.  + History of asthma and allergies.  She was previously receiving allergy injections however discontinued this approximately a year ago after moving to the area.          Patient's Social History: Reviewed and confirmed in epic as below:    Social History     Socioeconomic History    Marital status: Single     Spouse name: None    Number of children: None    Years of education: None    Highest education level: None   Tobacco Use    Smoking status: Never    Smokeless tobacco: Never   Substance and Sexual Activity    Alcohol use: Yes     Alcohol/week: 4.0 standard drinks of alcohol     Types: 4 Glasses of wine per week    Drug use: Never       Patient's Allergies Reviewed and confirmed as listed below    Allergies   Allergen Reactions    Molds & Smuts     Seasonal        Current Medications reviewed and confirmed:    Outpatient Medications Marked as Taking for the 01/18/23 encounter (Office Visit) with Carolina Sink, MD   Medication Sig Dispense Refill    methylPREDNISolone (MEDROL DOSEPACK) 4 MG tablet Administer methylprednisolone Dosepak  (4 mg tabs) over 6 days.  See admin instructions. 1 kit 0       Nursing Notes and Vital Signs reviewed    EXAM:    VITALS: BP 100/69   Pulse 100   Temp 100 F (37.8 C)   Resp 18   SpO2 100%     GENERAL: alert, well appearing, well nourished, no distress    HEAD: Normocephalic, no masses, lesions, or other abnormalities.    EYE EXAM: normal conjunctiva    EARS: External ears normal, hearing grossly intact.    NOSE: no deformity.  No sinus tenderness/facial tenderness    OROPHARYNX: Mild lip swelling.  Tongue and posterior pharynx are widely patent/normal    HEART: regular rate and rhythm    LUNGS: Normal respiratory rate and normal respiratory effort. Good air movement. Bilaterally clear    No results found.       DIAGNOSIS:    Diagnosis Orders   1. COVID-19  POC COVID-19 & Influenza Combo ([facility])      2. Swelling  of both lips  methylPREDNISolone (MEDROL DOSEPACK) 4 MG tablet    EPINEPHrine (EPIPEN) 0.3 MG/0.3ML injection 0.3 mg          PLAN:  1. COVID-19  -     POC COVID-19 & Influenza Combo ([facility])  2. Swelling of both lips  -     methylPREDNISolone (MEDROL DOSEPACK) 4 MG tablet; Administer methylprednisolone Dosepak (4 mg tabs) over 6 days.  See admin instructions., Disp-1 kit, R-0Normal  -     EPINEPHrine (EPIPEN) 0.3 MG/0.3ML injection 0.3 mg; 0.3 mg, IntraMUSCular, ONCE, 1 dose, On Thu 01/18/23 at 1715      Results for orders placed or performed in visit on 01/18/23   POC COVID-19 & Influenza Combo ([facility])   Result Value Ref Range    SARS-CoV-2 Detected (A) Not Detected    INFLUENZA A Not Detected Not Detected    INFLUENZA B Not Detected Not Detected       Current Outpatient Medications   Medication Sig Dispense Refill    methylPREDNISolone (MEDROL DOSEPACK) 4 MG tablet Administer methylprednisolone Dosepak (4 mg tabs) over 6 days.  See admin instructions. 1 kit 0    albuterol sulfate HFA (PROVENTIL;VENTOLIN;PROAIR) 108 (90 Base) MCG/ACT inhaler Inhale 2 puffs into the lungs every 4  hours as needed (Patient not taking: Reported on 01/18/2023)      lamoTRIgine (LAMICTAL) 100 MG tablet  (Patient not taking: Reported on 03/13/2022)      amphetamine-dextroamphetamine (ADDERALL) 10 MG tablet  (Patient not taking: Reported on 01/18/2023)      clonazePAM (KLONOPIN) 0.5 MG tablet  (Patient not taking: Reported on 10/25/2021)      clonazePAM (KLONOPIN) 1 MG tablet  (Patient not taking: Reported on 03/13/2022)      NIKKI 3-0.02 MG per tablet  (Patient not taking: Reported on 10/25/2021)      hydrOXYzine HCl (ATARAX) 10 MG tablet  (Patient not taking: Reported on 10/25/2021)      lamoTRIgine (LAMICTAL) 200 MG tablet  (Patient not taking: Reported on 10/25/2021)      sertraline (ZOLOFT) 50 MG tablet  (Patient not taking: Reported on 03/13/2022)       Current Facility-Administered Medications   Medication Dose Route Frequency Provider Last Rate Last Admin    EPINEPHrine (EPIPEN) 0.3 MG/0.3ML injection 0.3 mg  0.3 mg IntraMUSCular Once Carolina Sink, MD            Orders Placed This Encounter   Medications    methylPREDNISolone (MEDROL DOSEPACK) 4 MG tablet     Sig: Administer methylprednisolone Dosepak (4 mg tabs) over 6 days.  See admin instructions.     Dispense:  1 kit     Refill:  0    EPINEPHrine (EPIPEN) 0.3 MG/0.3ML injection 0.3 mg         ED precautions discussed at length  Will refill EpiPen as her current EpiPen is likely expired.    Persons who have COVID-19 who have symptoms and were directed to care for themselves at home may discontinue home isolation under the following conditions:    Quarantine for 5 days  If you have no symptoms or your symptoms are resolving after 5 days, you can end quarantine  Continue to wear a mask around others for 5 additional days strictly    Recommend rest, increase po fluids, OTCs prn as discussed    ADVISED PATIENT TO SEEK MEDICAL ATTENTION FOR SYMPTOMS PERSISTING MORE THAN ONE WEEK OR FOR WORSENING SYMPTOMS SUCH  AS FEVER 100.4 OR GREATER FOR MORE THAN 2 DAYS,  INCREASED WHEEZING, SHORTNESS OF BREATH, FATIGUE OR CHEST PAIN.      Carolina Sink, MD

## 2023-02-28 ENCOUNTER — Ambulatory Visit: Admit: 2023-02-28 | Discharge: 2023-02-28 | Payer: PRIVATE HEALTH INSURANCE | Attending: Family Medicine

## 2023-02-28 DIAGNOSIS — J101 Influenza due to other identified influenza virus with other respiratory manifestations: Secondary | ICD-10-CM

## 2023-02-28 LAB — POCT INFLUENZA A/B,AG,EIA
Inflenza A Ag: POSITIVE
Influenza B Ag: NEGATIVE

## 2023-02-28 MED ORDER — OSELTAMIVIR PHOSPHATE 75 MG PO CAPS
75 | ORAL_CAPSULE | Freq: Two times a day (BID) | ORAL | 0 refills | Status: AC
Start: 2023-02-28 — End: 2023-03-05

## 2023-02-28 NOTE — Progress Notes (Signed)
[name]    Source: Patient appears reliable    Add'l history from: Epic records    Time Patient seen by Provider:8:35 AM     Chief complaint:    Chief Complaint   Patient presents with    Cough     Patient reports cough, body aches, fatigue and fever - started yesterday.        HISTORY:    Meagan Trevino is a 37 y.o. female who presents for evaluation of   Chief Complaint   Patient presents with    Cough     Patient reports cough, body aches, fatigue and fever - started yesterday.        37 year old female with past medical history as noted now presents with 1 day history of fever, diffuse myalgia, cough, fatigue.  No known exposure to specific illness.  She has not taken anything for symptoms as of yet      Patient's Social History: Reviewed and confirmed in epic as below:    Social History     Socioeconomic History    Marital status: Single     Spouse name: None    Number of children: None    Years of education: None    Highest education level: None   Tobacco Use    Smoking status: Never    Smokeless tobacco: Never   Substance and Sexual Activity    Alcohol use: Yes     Alcohol/week: 4.0 standard drinks of alcohol     Types: 4 Glasses of wine per week    Drug use: Never       Patient's Allergies Reviewed and confirmed as listed below    Allergies   Allergen Reactions    Molds & Smuts     Seasonal        Current Medications reviewed and confirmed:    Outpatient Medications Marked as Taking for the 02/28/23 encounter (Office Visit) with Carolina Sink, MD   Medication Sig Dispense Refill    desvenlafaxine succinate (PRISTIQ) 100 MG TB24 extended release tablet       oseltamivir (TAMIFLU) 75 MG capsule Take 1 capsule by mouth 2 times daily for 5 days 10 capsule 0       Nursing Notes and Vital Signs reviewed    EXAM:    VITALS: BP 129/78   Pulse (!) 104   Temp (!) 101.3 F (38.5 C)   Resp 16   Wt 59 kg (130 lb)   SpO2 100%   BMI 20.98 kg/m     GENERAL: alert, mildly ill-appearing, well-nourished    HEAD:  Normocephalic, no masses, lesions, or other abnormalities.    EYE EXAM: normal conjunctiva    EARS: External ears normal, hearing grossly intact.    NOSE: no deformity.    OROPHARYNX: lips and tongue normal. No erythema    HEART: regular rate and rhythm    LUNGS: Normal respiratory rate and normal respiratory effort. Good air movement. Bilaterally clear    No results found.       DIAGNOSIS:    Diagnosis Orders   1. Influenza A  oseltamivir (TAMIFLU) 75 MG capsule      2. Acute cough  POC Influenza A/B, AG, EIA (09811)          PLAN:  1. Influenza A  -     oseltamivir (TAMIFLU) 75 MG capsule; Take 1 capsule by mouth 2 times daily for 5 days, Disp-10 capsule, R-0Normal  2. Acute cough  -  POC Influenza A/B, AG, EIA (16109)      Results for orders placed or performed in visit on 02/28/23   POC Influenza A/B, AG, EIA (60454)   Result Value Ref Range    Inflenza A Ag Positive     Influenza B Ag Negative     Valid Internal Control, POC Present        Current Outpatient Medications   Medication Sig Dispense Refill    desvenlafaxine succinate (PRISTIQ) 100 MG TB24 extended release tablet       oseltamivir (TAMIFLU) 75 MG capsule Take 1 capsule by mouth 2 times daily for 5 days 10 capsule 0    albuterol sulfate HFA (PROVENTIL;VENTOLIN;PROAIR) 108 (90 Base) MCG/ACT inhaler Inhale 2 puffs into the lungs every 4 hours as needed (Patient not taking: Reported on 01/18/2023)      amphetamine-dextroamphetamine (ADDERALL) 10 MG tablet  (Patient not taking: Reported on 01/18/2023)      clonazePAM (KLONOPIN) 0.5 MG tablet  (Patient not taking: Reported on 10/25/2021)      clonazePAM (KLONOPIN) 1 MG tablet  (Patient not taking: Reported on 03/13/2022)       Current Facility-Administered Medications   Medication Dose Route Frequency Provider Last Rate Last Admin    EPINEPHrine (EPIPEN) 0.3 MG/0.3ML injection 0.3 mg  0.3 mg IntraMUSCular Once Carolina Sink, MD            Orders Placed This Encounter   Medications    oseltamivir  (TAMIFLU) 75 MG capsule     Sig: Take 1 capsule by mouth 2 times daily for 5 days     Dispense:  10 capsule     Refill:  0       Influenza (Flu): Care Instructions  Overview     Influenza (flu) is an infection in the lungs and breathing passages. It is caused by the influenza virus. There are different strains, or types, of the flu virus from year to year. Unlike the common cold, the flu comes on suddenly and the symptoms can be more severe. These symptoms include a cough, congestion, fever, chills, fatigue, aches, and pains. These symptoms may last for a few weeks. Although the flu can make you feel very sick, it usually doesn't cause serious health problems.  Home treatment is usually all you need for flu symptoms. But your doctor may prescribe antiviral medicine to prevent other health problems, such as pneumonia, from developing. The risk of other health problems from the flu is highest for young children (under 2), older adults (over 27), pregnant women, and people with long-term health conditions.  Follow-up care is a key part of your treatment and safety. Be sure to make and go to all appointments, and call your doctor if you are having problems. It's also a good idea to know your test results and keep a list of the medicines you take.  How can you care for yourself at home?  Get plenty of rest.  Drink plenty of fluids. If you have to limit fluids because of a health problem, talk with your doctor before you increase the amount of fluids you drink.  Take an over-the-counter pain medicine if needed, such as acetaminophen (Tylenol), ibuprofen (Advil, Motrin), or naproxen (Aleve), to relieve fever, headache, and muscle aches. Be safe with medicines. Read and follow all instructions on the label.  No one younger than 20 should take aspirin. It has been linked to Reye syndrome, a serious illness.  Take any prescribed medicine exactly as  directed.  Do not smoke. Smoking can make the flu worse. If you need help  quitting, talk to your doctor about stop-smoking programs and medicines. These can increase your chances of quitting for good.  If the skin around your nose and lips becomes sore, put some petroleum jelly (such as Vaseline) on the area.  To ease coughing:  Suck on cough drops or plain, hard candy.  Try an over-the-counter cough or cold medicine. Read and follow all instructions on the label.  Raise your head at night with an extra pillow. This may help you rest if coughing keeps you awake.  To avoid spreading the flu  Wash your hands regularly, and keep your hands away from your face.  Stay home from school, work, and other public places until you are feeling better and your fever has been gone for at least 24 hours. The fever needs to have gone away on its own without the help of medicine.  Ask people living with you to talk to their doctors about preventing the flu. They may get antiviral medicine to keep from getting the flu from you.  To prevent the flu in the future, get the flu vaccine every fall. Encourage people living with you to get the vaccine.  Cover your mouth when you cough or sneeze. If you can, cough or sneeze into the bend of your elbow, not your hands.  When should you call for help?   Call 911 anytime you think you may need emergency care. For example, call if:    You have severe trouble breathing.     You have a seizure.   Call your doctor now or seek immediate medical care if:    You have trouble breathing.     You have a fever with a stiff neck or a severe headache.     You have pain or pressure in your chest or belly.     You have a fever or cough that returns after getting better.     You feel very sleepy, dizzy, or confused.     You are not urinating.     You have severe muscle pain.     You have severe weakness, or you are unsteady.     You have medical conditions that are getting worse.   Watch closely for changes in your health, and be sure to contact your doctor if:    You do not get  better as expected.     You are having a problem with your medicine.   Where can you learn more?  Go to https://www.bennett.info/ and enter [phone] to learn more about "Influenza (Flu): Care Instructions."  Current as of: May 15, 2022               Content Version: 14.0   2006-2024 [employer].   Care instructions adapted under license by [facility]. If you have questions about a medical condition or this instruction, always ask your healthcare professional. [place] any warranty or liability for your use of this information.        Carolina Sink, MD

## 2024-03-07 ENCOUNTER — Ambulatory Visit: Admit: 2024-03-07 | Discharge: 2024-03-07 | Attending: Family

## 2024-03-07 VITALS — BP 111/75 | HR 118 | Temp 98.10000°F | Resp 20 | Wt 130.0 lb

## 2024-03-07 DIAGNOSIS — J101 Influenza due to other identified influenza virus with other respiratory manifestations: Secondary | ICD-10-CM

## 2024-03-07 LAB — AMB POC INFLUENZA ASSAY W/OPTIC
Flu A Antigen: NEGATIVE
Flu B Antigen: POSITIVE

## 2024-03-07 LAB — POCT COVID-19 ANTIGEN CARD
Lot Number: 874589
SARS-COV-2, POC: NOT DETECTED

## 2024-03-07 MED ORDER — OSELTAMIVIR PHOSPHATE 75 MG PO CAPS
75 | ORAL_CAPSULE | Freq: Two times a day (BID) | ORAL | 0 refills | Status: AC
Start: 2024-03-07 — End: 2024-03-12

## 2024-03-07 NOTE — Progress Notes (Signed)
 Meagan Trevino (DOB:  [birth year]) is a 38 y.o. female, here for evaluation of the following chief complaint(s): Cough (Cough,congestion, fever, sore throat. Sx started 1-2 days ago)      Assessment & Plan     Subjective   SUBJECTIVE/OBJECTIVE:      Cough      38 year old female presents with a 2-day history of acute onset dry cough, sinus congestion, fever, sore throat, body aches and chills. No shortness of breath or chest pain.  She has tried taking over-the-counter medications which do help relieve her symptoms.    Allergies   Allergen Reactions    Molds & Smuts     Seasonal         Current Outpatient Medications on File Prior to Visit   Medication Sig Dispense Refill    desvenlafaxine succinate (PRISTIQ) 100 MG TB24 extended release tablet  (Patient not taking: Reported on 03/07/2024)      albuterol sulfate HFA (PROVENTIL;VENTOLIN;PROAIR) 108 (90 Base) MCG/ACT inhaler Inhale 2 puffs into the lungs every 4 hours as needed (Patient not taking: Reported on 03/07/2024)      amphetamine-dextroamphetamine (ADDERALL) 10 MG tablet  (Patient not taking: Reported on 03/07/2024)      clonazePAM (KLONOPIN) 0.5 MG tablet  (Patient not taking: Reported on 03/07/2024)      clonazePAM (KLONOPIN) 1 MG tablet  (Patient not taking: Reported on 03/07/2024)       Current Facility-Administered Medications on File Prior to Visit   Medication Dose Route Frequency Provider Last Rate Last Admin    EPINEPHrine (EPIPEN) 0.3 MG/0.3ML injection 0.3 mg  0.3 mg IntraMUSCular Once Cianci, Dwyane Luo, MD                History reviewed. No pertinent past medical history.     History reviewed. No pertinent surgical history.     Social History     Tobacco Use    Smoking status: Never    Smokeless tobacco: Never   Substance Use Topics    Alcohol use: Yes     Alcohol/week: 4.0 standard drinks of alcohol     Types: 4 Glasses of wine per week    Drug use: Never        Review of Systems   Respiratory:  Positive for cough.      See HPI; otherwise ROS negative.          Objective     Vitals:    03/07/24 0940   BP: 111/75   Pulse: (!) 118   Resp: 20   Temp: 98.1 F (36.7 C)   TempSrc: Oral   SpO2: 99%   Weight: 59 kg (130 lb)      Body mass index is 20.98 kg/m.         Physical Exam  Vitals and nursing note reviewed.   Constitutional:       General: She is not in acute distress.     Appearance: Normal appearance.   HENT:      Head: Normocephalic and atraumatic.      Right Ear: Tympanic membrane normal.      Left Ear: Tympanic membrane normal.      Nose: Congestion and rhinorrhea present.      Mouth/Throat:      Mouth: Mucous membranes are moist.      Pharynx: No posterior oropharyngeal erythema.   Cardiovascular:      Rate and Rhythm: Normal rate and regular rhythm.   Pulmonary:  Effort: Pulmonary effort is normal.      Breath sounds: Normal breath sounds.   Skin:     General: Skin is warm and dry.   Neurological:      General: No focal deficit present.      Mental Status: She is alert and oriented to person, place, and time.   Psychiatric:         Mood and Affect: Mood normal.         Behavior: Behavior normal.         Thought Content: Thought content normal.         Judgment: Judgment normal.       No scans are attached to the encounter.  Results for orders placed or performed in visit on 03/07/24   POCT COVID-19 Antigen Card   Result Value Ref Range    SARS-COV-2, POC Not-Detected Not Detected    Lot Number [id number]     QC Pass/Fail pass    AMB POC INFLUENZA ASSAY W/OPTIC   Result Value Ref Range    Valid Internal Control, POC present     Flu A Antigen negative     Flu B Antigen positive              ASSESSMENT/PLAN:  1. Influenza B  -     oseltamivir (TAMIFLU) 75 MG capsule; Take 1 capsule by mouth in the morning and 1 capsule in the evening. Do all this for 5 days., Disp-10 capsule, R-0Normal  2. Acute cough  -     POCT COVID-19 Antigen Card  -     AMB POC INFLUENZA ASSAY W/OPTIC    Patient is positive for Influenza today. Start Rx as above. Push fluids, rest. Return  promptly with any worsening respiratory symptoms. Can use additional OTC medications PRN.   Patient to call our office or return with any questions, concerns or worsening signs or symptoms.    An electronic signature was used to authenticate this note.    --Deatra Canter, APRN - NP
